# Patient Record
Sex: Male | Born: 2000 | Race: White | Hispanic: No | Marital: Single | Smoking: Never smoker
Health system: Southern US, Community
[De-identification: ages and names within clinical notes are randomized; demographics above are authoritative.]

## PROBLEM LIST (undated history)

## (undated) HISTORY — PX: APPENDECTOMY: SHX54

---
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
## 2019-01-31 ENCOUNTER — Other Ambulatory Visit: Payer: Self-pay

## 2019-01-31 ENCOUNTER — Emergency Department (HOSPITAL_COMMUNITY): Payer: Medicaid Other

## 2019-01-31 ENCOUNTER — Emergency Department (HOSPITAL_COMMUNITY): Payer: Medicaid Other | Admitting: Anesthesiology

## 2019-01-31 ENCOUNTER — Inpatient Hospital Stay (HOSPITAL_COMMUNITY): Payer: Medicaid Other

## 2019-01-31 ENCOUNTER — Inpatient Hospital Stay (HOSPITAL_COMMUNITY)
Admission: EM | Admit: 2019-01-31 | Discharge: 2019-02-05 | DRG: 958 | Disposition: A | Discharge status: Home / Self Care | Payer: Medicaid Other | Attending: Physician Assistant | Admitting: Physician Assistant

## 2019-01-31 ENCOUNTER — Encounter (HOSPITAL_COMMUNITY): Payer: Self-pay

## 2019-01-31 ENCOUNTER — Encounter (HOSPITAL_COMMUNITY): Admission: EM | Disposition: A | Discharge status: Home / Self Care | Payer: Self-pay | Source: Home / Self Care

## 2019-01-31 DIAGNOSIS — D62 Acute posthemorrhagic anemia: Secondary | ICD-10-CM | POA: Diagnosis present

## 2019-01-31 DIAGNOSIS — S82202C Unspecified fracture of shaft of left tibia, initial encounter for open fracture type IIIA, IIIB, or IIIC: Secondary | ICD-10-CM

## 2019-01-31 DIAGNOSIS — T148XXA Other injury of unspecified body region, initial encounter: Secondary | ICD-10-CM

## 2019-01-31 DIAGNOSIS — Z23 Encounter for immunization: Secondary | ICD-10-CM | POA: Diagnosis not present

## 2019-01-31 DIAGNOSIS — S92061B Displaced intraarticular fracture of right calcaneus, initial encounter for open fracture: Secondary | ICD-10-CM

## 2019-01-31 DIAGNOSIS — S37012A Minor contusion of left kidney, initial encounter: Secondary | ICD-10-CM | POA: Diagnosis present

## 2019-01-31 DIAGNOSIS — S82251C Displaced comminuted fracture of shaft of right tibia, initial encounter for open fracture type IIIA, IIIB, or IIIC: Secondary | ICD-10-CM | POA: Diagnosis present

## 2019-01-31 DIAGNOSIS — S92251A Displaced fracture of navicular [scaphoid] of right foot, initial encounter for closed fracture: Secondary | ICD-10-CM | POA: Diagnosis present

## 2019-01-31 DIAGNOSIS — R40241 Glasgow coma scale score 13-15, unspecified time: Secondary | ICD-10-CM | POA: Diagnosis present

## 2019-01-31 DIAGNOSIS — Z880 Allergy status to penicillin: Secondary | ICD-10-CM

## 2019-01-31 DIAGNOSIS — Y92410 Unspecified street and highway as the place of occurrence of the external cause: Secondary | ICD-10-CM | POA: Diagnosis not present

## 2019-01-31 DIAGNOSIS — Z1159 Encounter for screening for other viral diseases: Secondary | ICD-10-CM | POA: Diagnosis not present

## 2019-01-31 DIAGNOSIS — S9304XA Dislocation of right ankle joint, initial encounter: Secondary | ICD-10-CM | POA: Diagnosis present

## 2019-01-31 DIAGNOSIS — M21851 Other specified acquired deformities of right thigh: Secondary | ICD-10-CM | POA: Diagnosis present

## 2019-01-31 DIAGNOSIS — S92001B Unspecified fracture of right calcaneus, initial encounter for open fracture: Secondary | ICD-10-CM | POA: Diagnosis present

## 2019-01-31 DIAGNOSIS — S12600A Unspecified displaced fracture of seventh cervical vertebra, initial encounter for closed fracture: Secondary | ICD-10-CM | POA: Diagnosis present

## 2019-01-31 DIAGNOSIS — S93314A Dislocation of tarsal joint of right foot, initial encounter: Secondary | ICD-10-CM

## 2019-01-31 DIAGNOSIS — S92001A Unspecified fracture of right calcaneus, initial encounter for closed fracture: Secondary | ICD-10-CM

## 2019-01-31 DIAGNOSIS — S82201B Unspecified fracture of shaft of right tibia, initial encounter for open fracture type I or II: Secondary | ICD-10-CM | POA: Diagnosis present

## 2019-01-31 DIAGNOSIS — Z419 Encounter for procedure for purposes other than remedying health state, unspecified: Secondary | ICD-10-CM

## 2019-01-31 DIAGNOSIS — S37019A Minor contusion of unspecified kidney, initial encounter: Secondary | ICD-10-CM

## 2019-01-31 HISTORY — PX: APPLICATION OF WOUND VAC: SHX5189

## 2019-01-31 HISTORY — PX: EXTERNAL FIXATION LEG: SHX1549

## 2019-01-31 HISTORY — PX: ORIF ANKLE FRACTURE: SUR919

## 2019-01-31 LAB — COMPREHENSIVE METABOLIC PANEL
ALT: 35 U/L (ref 0–44)
AST: 34 U/L (ref 15–41)
Albumin: 4.2 g/dL (ref 3.5–5.0)
Alkaline Phosphatase: 55 U/L (ref 38–126)
Anion gap: 12 (ref 5–15)
BUN: 10 mg/dL (ref 6–20)
CO2: 21 mmol/L — ABNORMAL LOW (ref 22–32)
Calcium: 9.6 mg/dL (ref 8.9–10.3)
Chloride: 105 mmol/L (ref 98–111)
Creatinine, Ser: 0.96 mg/dL (ref 0.61–1.24)
GFR calc Af Amer: >60 mL/min (ref >60)
GFR calc non Af Amer: >60 mL/min (ref >60)
Glucose, Bld: 182 mg/dL — ABNORMAL HIGH (ref 70–99)
Potassium: 4 mmol/L (ref 3.5–5.1)
Sodium: 138 mmol/L (ref 135–145)
Total Bilirubin: 1 mg/dL (ref 0.3–1.2)
Total Protein: 7.1 g/dL (ref 6.5–8.1)

## 2019-01-31 LAB — CBC
HCT: 46.3 % (ref 39.0–52.0)
Hemoglobin: 15.1 g/dL (ref 13.0–17.0)
MCH: 31.1 pg (ref 26.0–34.0)
MCHC: 32.6 g/dL (ref 30.0–36.0)
MCV: 95.5 fL (ref 80.0–100.0)
Platelets: 322 10*3/uL (ref 150–400)
RBC: 4.85 MIL/uL (ref 4.22–5.81)
RDW: 12 % (ref 11.5–15.5)
WBC: 17.7 10*3/uL — ABNORMAL HIGH (ref 4.0–10.5)
nRBC: 0 % (ref 0.0–0.2)

## 2019-01-31 LAB — I-STAT CHEM 8, ED
BUN: 12 mg/dL (ref 6–20)
Calcium, Ion: 1.06 mmol/L — ABNORMAL LOW (ref 1.15–1.40)
Chloride: 107 mmol/L (ref 98–111)
Creatinine, Ser: 0.9 mg/dL (ref 0.61–1.24)
Glucose, Bld: 180 mg/dL — ABNORMAL HIGH (ref 70–99)
HCT: 46 % (ref 39.0–52.0)
Hemoglobin: 15.6 g/dL (ref 13.0–17.0)
Potassium: 3.8 mmol/L (ref 3.5–5.1)
Sodium: 139 mmol/L (ref 135–145)
TCO2: 23 mmol/L (ref 22–32)

## 2019-01-31 LAB — PROTIME-INR
INR: 1 (ref 0.8–1.2)
Prothrombin Time: 13 seconds (ref 11.4–15.2)

## 2019-01-31 LAB — SARS CORONAVIRUS 2 BY RT PCR (HOSPITAL ORDER, PERFORMED IN ~~LOC~~ HOSPITAL LAB): SARS Coronavirus 2: NEGATIVE

## 2019-01-31 LAB — ABO/RH: ABO/RH(D): A POS

## 2019-01-31 LAB — ETHANOL: Alcohol, Ethyl (B): <10 mg/dL (ref <10)

## 2019-01-31 SURGERY — EXTERNAL FIXATION, LOWER EXTREMITY
Anesthesia: General | Site: Leg Lower | Laterality: Right

## 2019-01-31 MED ORDER — FENTANYL CITRATE (PF) 100 MCG/2ML IJ SOLN
INTRAMUSCULAR | Status: AC
  Filled 2019-01-31: qty 2

## 2019-01-31 MED ORDER — ENOXAPARIN SODIUM 40 MG/0.4ML ~~LOC~~ SOLN
40.0000 mg | Freq: Every day | SUBCUTANEOUS | Status: DC
  Administered 2019-02-01 – 2019-02-04 (×3): 40 mg via SUBCUTANEOUS
  Filled 2019-01-31 (×3): qty 0.4

## 2019-01-31 MED ORDER — SUFENTANIL CITRATE 50 MCG/ML IV SOLN
INTRAVENOUS | Status: DC | PRN
  Administered 2019-01-31 (×2): 10 ug via INTRAVENOUS

## 2019-01-31 MED ORDER — ACETAMINOPHEN 160 MG/5ML PO SOLN: 1000.0000 mg | Freq: Once | ORAL | Status: DC | PRN

## 2019-01-31 MED ORDER — POTASSIUM CHLORIDE IN NACL 20-0.9 MEQ/L-% IV SOLN
INTRAVENOUS | Status: DC
  Administered 2019-02-01: via INTRAVENOUS
  Filled 2019-01-31: qty 1000

## 2019-01-31 MED ORDER — SUFENTANIL CITRATE 50 MCG/ML IV SOLN
INTRAVENOUS | Status: AC
  Filled 2019-01-31: qty 1

## 2019-01-31 MED ORDER — PROPOFOL 10 MG/ML IV BOLUS
INTRAVENOUS | Status: DC | PRN
  Administered 2019-01-31 (×2): 50 mg via INTRAVENOUS
  Administered 2019-01-31: 100 mg via INTRAVENOUS
  Administered 2019-01-31: 50 mg via INTRAVENOUS

## 2019-01-31 MED ORDER — SODIUM CHLORIDE 0.9 % IR SOLN
Status: DC | PRN
  Administered 2019-01-31 (×2): 3000 mL

## 2019-01-31 MED ORDER — OXYCODONE HCL 5 MG PO TABS: 5.0000 mg | ORAL_TABLET | Freq: Once | ORAL | Status: DC | PRN

## 2019-01-31 MED ORDER — LIDOCAINE 2% (20 MG/ML) 5 ML SYRINGE
INTRAMUSCULAR | Status: DC | PRN
  Administered 2019-01-31: 60 mg via INTRAVENOUS

## 2019-01-31 MED ORDER — KETOROLAC TROMETHAMINE 30 MG/ML IJ SOLN
INTRAMUSCULAR | Status: AC
  Filled 2019-01-31: qty 1

## 2019-01-31 MED ORDER — CEFAZOLIN SODIUM-DEXTROSE 1-4 GM/50ML-% IV SOLN
1.0000 g | Freq: Once | INTRAVENOUS | Status: AC
  Administered 2019-01-31: 1 g via INTRAVENOUS
  Filled 2019-01-31: qty 50

## 2019-01-31 MED ORDER — SUCCINYLCHOLINE CHLORIDE 200 MG/10ML IV SOSY
PREFILLED_SYRINGE | INTRAVENOUS | Status: DC | PRN
  Administered 2019-01-31: 80 mg via INTRAVENOUS

## 2019-01-31 MED ORDER — LIDOCAINE 2% (20 MG/ML) 5 ML SYRINGE
INTRAMUSCULAR | Status: AC
  Filled 2019-01-31: qty 5

## 2019-01-31 MED ORDER — VANCOMYCIN HCL 1000 MG IV SOLR
INTRAVENOUS | Status: AC
  Filled 2019-01-31: qty 1000

## 2019-01-31 MED ORDER — FENTANYL CITRATE (PF) 100 MCG/2ML IJ SOLN: 50.0000 ug | Freq: Once | INTRAMUSCULAR | Status: DC

## 2019-01-31 MED ORDER — TETANUS-DIPHTH-ACELL PERTUSSIS 5-2.5-18.5 LF-MCG/0.5 IM SUSP
0.5000 mL | Freq: Once | INTRAMUSCULAR | Status: AC
  Administered 2019-01-31: 0.5 mL via INTRAMUSCULAR
  Filled 2019-01-31: qty 0.5

## 2019-01-31 MED ORDER — CEFAZOLIN SODIUM-DEXTROSE 2-4 GM/100ML-% IV SOLN
INTRAVENOUS | Status: AC
  Filled 2019-01-31: qty 100

## 2019-01-31 MED ORDER — CEFAZOLIN SODIUM-DEXTROSE 2-3 GM-%(50ML) IV SOLR
INTRAVENOUS | Status: DC | PRN
  Administered 2019-01-31: 2 g via INTRAVENOUS

## 2019-01-31 MED ORDER — VANCOMYCIN HCL 1000 MG IV SOLR
INTRAVENOUS | Status: DC | PRN
  Administered 2019-01-31: 1000 mg

## 2019-01-31 MED ORDER — HYDROMORPHONE HCL 1 MG/ML IJ SOLN
1.0000 mg | Freq: Once | INTRAMUSCULAR | Status: AC
  Administered 2019-01-31: 1 mg via INTRAVENOUS
  Filled 2019-01-31: qty 1

## 2019-01-31 MED ORDER — VANCOMYCIN HCL IN DEXTROSE 1-5 GM/200ML-% IV SOLN
INTRAVENOUS | Status: AC
  Filled 2019-01-31: qty 200

## 2019-01-31 MED ORDER — DEXAMETHASONE SODIUM PHOSPHATE 10 MG/ML IJ SOLN
INTRAMUSCULAR | Status: DC | PRN
  Administered 2019-01-31: 10 mg via INTRAVENOUS

## 2019-01-31 MED ORDER — IOHEXOL 300 MG/ML  SOLN
100.0000 mL | Freq: Once | INTRAMUSCULAR | Status: AC | PRN
  Administered 2019-01-31: 100 mL via INTRAVENOUS

## 2019-01-31 MED ORDER — CLINDAMYCIN PHOSPHATE 300 MG/50ML IV SOLN: 300.0000 mg | Freq: Once | INTRAVENOUS | Status: DC

## 2019-01-31 MED ORDER — PROPOFOL 10 MG/ML IV BOLUS
INTRAVENOUS | Status: AC
  Filled 2019-01-31: qty 20

## 2019-01-31 MED ORDER — SODIUM CHLORIDE (PF) 0.9 % IJ SOLN
INTRAMUSCULAR | Status: AC
  Filled 2019-01-31: qty 10

## 2019-01-31 MED ORDER — TOBRAMYCIN SULFATE 1.2 G IJ SOLR
INTRAMUSCULAR | Status: AC
  Filled 2019-01-31: qty 1.2

## 2019-01-31 MED ORDER — OXYCODONE HCL 5 MG/5ML PO SOLN: 5.0000 mg | Freq: Once | ORAL | Status: DC | PRN

## 2019-01-31 MED ORDER — GENTAMICIN SULFATE 40 MG/ML IJ SOLN: 300.0000 mg | Freq: Once | INTRAVENOUS | Status: DC

## 2019-01-31 MED ORDER — ACETAMINOPHEN 10 MG/ML IV SOLN: 1000.0000 mg | Freq: Once | INTRAVENOUS | Status: DC | PRN

## 2019-01-31 MED ORDER — DEXAMETHASONE SODIUM PHOSPHATE 10 MG/ML IJ SOLN
INTRAMUSCULAR | Status: AC
  Filled 2019-01-31: qty 1

## 2019-01-31 MED ORDER — LIDOCAINE-EPINEPHRINE (PF) 2 %-1:200000 IJ SOLN
20.0000 mL | Freq: Once | INTRAMUSCULAR | Status: DC
  Filled 2019-01-31: qty 20

## 2019-01-31 MED ORDER — TOBRAMYCIN SULFATE 1.2 G IJ SOLR
INTRAMUSCULAR | Status: DC | PRN
  Administered 2019-01-31: 1.2 g

## 2019-01-31 MED ORDER — KETOROLAC TROMETHAMINE 30 MG/ML IJ SOLN
INTRAMUSCULAR | Status: DC | PRN
  Administered 2019-01-31: 30 mg via INTRAVENOUS

## 2019-01-31 MED ORDER — LACTATED RINGERS IV SOLN
INTRAVENOUS | Status: DC | PRN
  Administered 2019-01-31 (×2): via INTRAVENOUS

## 2019-01-31 MED ORDER — FENTANYL CITRATE (PF) 100 MCG/2ML IJ SOLN
25.0000 ug | INTRAMUSCULAR | Status: DC | PRN
  Administered 2019-01-31: 50 ug via INTRAVENOUS

## 2019-01-31 MED ORDER — ONDANSETRON HCL 4 MG/2ML IJ SOLN
INTRAMUSCULAR | Status: DC | PRN
  Administered 2019-01-31: 4 mg via INTRAVENOUS

## 2019-01-31 MED ORDER — VANCOMYCIN HCL 1 G IV SOLR
1000.0000 mg | INTRAVENOUS | Status: AC
  Filled 2019-01-31: qty 1000

## 2019-01-31 MED ORDER — ACETAMINOPHEN 500 MG PO TABS: 1000.0000 mg | ORAL_TABLET | Freq: Once | ORAL | Status: DC | PRN

## 2019-01-31 MED ORDER — MIDAZOLAM HCL 2 MG/2ML IJ SOLN
INTRAMUSCULAR | Status: AC
  Filled 2019-01-31: qty 2

## 2019-01-31 MED ORDER — MIDAZOLAM HCL 5 MG/5ML IJ SOLN
INTRAMUSCULAR | Status: DC | PRN
  Administered 2019-01-31: 2 mg via INTRAVENOUS

## 2019-01-31 MED ORDER — 0.9 % SODIUM CHLORIDE (POUR BTL) OPTIME
TOPICAL | Status: DC | PRN
  Administered 2019-01-31: 21:00:00 1000 mL

## 2019-01-31 MED ORDER — ONDANSETRON HCL 4 MG/2ML IJ SOLN
INTRAMUSCULAR | Status: AC
  Filled 2019-01-31: qty 2

## 2019-01-31 MED ORDER — SUCCINYLCHOLINE CHLORIDE 200 MG/10ML IV SOSY
PREFILLED_SYRINGE | INTRAVENOUS | Status: AC
  Filled 2019-01-31: qty 10

## 2019-01-31 SURGICAL SUPPLY — 72 items
ALCOHOL 70% 16 OZ (MISCELLANEOUS) ×3 IMPLANT
BANDAGE ACE 4X5 VEL STRL LF (GAUZE/BANDAGES/DRESSINGS) IMPLANT
BANDAGE ACE 6X5 VEL STRL LF (GAUZE/BANDAGES/DRESSINGS) IMPLANT
BANDAGE ELASTIC 4 VELCRO ST LF (GAUZE/BANDAGES/DRESSINGS) ×3 IMPLANT
BANDAGE ELASTIC 6 VELCRO ST LF (GAUZE/BANDAGES/DRESSINGS) ×3 IMPLANT
BIT DRILL 110X2.5XQCK CNCT (BIT) ×1 IMPLANT
BIT DRILL 2.5 (BIT) ×2
BIT DRILL 2.7 (BIT) ×1
BIT DRILL 2.7MM (BIT) ×1 IMPLANT
BIT DRL 110X2.5XQCK CNCT (BIT) ×1
BNDG COHESIVE 6X5 TAN STRL LF (GAUZE/BANDAGES/DRESSINGS) IMPLANT
BNDG ESMARK 4X9 LF (GAUZE/BANDAGES/DRESSINGS) ×3 IMPLANT
BNDG GAUZE ELAST 4 BULKY (GAUZE/BANDAGES/DRESSINGS) IMPLANT
CANISTER WOUND CARE 500ML ATS (WOUND CARE) ×3 IMPLANT
CONNECTOR Y ATS VAC SYSTEM (MISCELLANEOUS) ×3 IMPLANT
COVER SURGICAL LIGHT HANDLE (MISCELLANEOUS) ×3 IMPLANT
COVER WAND RF STERILE (DRAPES) ×3 IMPLANT
DRAPE C-ARM 42X72 X-RAY (DRAPES) IMPLANT
DRAPE C-ARMOR (DRAPES) ×3 IMPLANT
DRAPE INCISE IOBAN 66X45 STRL (DRAPES) ×3 IMPLANT
DRAPE U-SHAPE 47X51 STRL (DRAPES) ×3 IMPLANT
DRILL BIT 2.7MM (BIT) ×2
DRSG MEPILEX BORDER 4X4 (GAUZE/BANDAGES/DRESSINGS) ×3 IMPLANT
DRSG VAC ATS SM SENSATRAC (GAUZE/BANDAGES/DRESSINGS) ×3 IMPLANT
ELECT REM PT RETURN 9FT ADLT (ELECTROSURGICAL) ×3
ELECTRODE REM PT RTRN 9FT ADLT (ELECTROSURGICAL) ×1 IMPLANT
GAUZE SPONGE 4X4 12PLY STRL (GAUZE/BANDAGES/DRESSINGS) IMPLANT
GAUZE XEROFORM 5X9 LF (GAUZE/BANDAGES/DRESSINGS) IMPLANT
GLOVE BIO SURGEON STRL SZ7.5 (GLOVE) ×3 IMPLANT
GLOVE BIOGEL PI IND STRL 7.0 (GLOVE) ×1 IMPLANT
GLOVE BIOGEL PI IND STRL 8 (GLOVE) ×2 IMPLANT
GLOVE BIOGEL PI INDICATOR 7.0 (GLOVE) ×2
GLOVE BIOGEL PI INDICATOR 8 (GLOVE) ×4
GOWN STRL REUS W/ TWL LRG LVL3 (GOWN DISPOSABLE) ×2 IMPLANT
GOWN STRL REUS W/ TWL XL LVL3 (GOWN DISPOSABLE) ×1 IMPLANT
GOWN STRL REUS W/TWL LRG LVL3 (GOWN DISPOSABLE) ×4
GOWN STRL REUS W/TWL XL LVL3 (GOWN DISPOSABLE) ×2
HANDPIECE INTERPULSE COAX TIP (DISPOSABLE) ×2
KIT BASIN OR (CUSTOM PROCEDURE TRAY) ×3 IMPLANT
KIT TURNOVER KIT B (KITS) ×3 IMPLANT
NEEDLE 22X1 1/2 (OR ONLY) (NEEDLE) IMPLANT
NS IRRIG 1000ML POUR BTL (IV SOLUTION) ×3 IMPLANT
PACK ORTHO EXTREMITY (CUSTOM PROCEDURE TRAY) ×3 IMPLANT
PAD ARMBOARD 7.5X6 YLW CONV (MISCELLANEOUS) ×6 IMPLANT
PAD CAST 4YDX4 CTTN HI CHSV (CAST SUPPLIES) ×1 IMPLANT
PAD NEG PRESSURE SENSATRAC (MISCELLANEOUS) ×3 IMPLANT
PADDING CAST COTTON 4X4 STRL (CAST SUPPLIES) ×2
PADDING CAST COTTON 6X4 STRL (CAST SUPPLIES) ×9 IMPLANT
PLATE COMPRESSION 12HOLE (Plate) ×3 IMPLANT
SCREW CORT 2.5X28X3.5XST SM (Screw) ×1 IMPLANT
SCREW CORTICAL 3.5X28MM (Screw) ×2 IMPLANT
SCREW CORTICAL 3.5X38MM (Screw) ×3 IMPLANT
SCREW LOCK 30X3.5X M THRD (Screw) ×2 IMPLANT
SCREW LOCKING 3.5 46MM (Screw) ×6 IMPLANT
SCREW LOCKING 3.5X28MM (Screw) ×6 IMPLANT
SCREW LOCKING 3.5X30MM (Screw) ×4 IMPLANT
SET HNDPC FAN SPRY TIP SCT (DISPOSABLE) ×1 IMPLANT
SPLINT PLASTER CAST XFAST 5X30 (CAST SUPPLIES) ×1 IMPLANT
SPLINT PLASTER XFAST SET 5X30 (CAST SUPPLIES) ×2
SPONGE LAP 18X18 RF (DISPOSABLE) IMPLANT
STAPLER VISISTAT 35W (STAPLE) IMPLANT
STOCKINETTE IMPERVIOUS LG (DRAPES) ×3 IMPLANT
SUCTION FRAZIER TIP 10 FR DISP (SUCTIONS) ×3 IMPLANT
SUT ETHILON 2 0 FS 18 (SUTURE) ×15 IMPLANT
SYR CONTROL 10ML LL (SYRINGE) IMPLANT
TOWEL OR 17X24 6PK STRL BLUE (TOWEL DISPOSABLE) ×6 IMPLANT
TOWEL OR 17X26 10 PK STRL BLUE (TOWEL DISPOSABLE) ×6 IMPLANT
TUBE CONNECTING 12'X1/4 (SUCTIONS) ×1
TUBE CONNECTING 12X1/4 (SUCTIONS) ×2 IMPLANT
UNDERPAD 30X30 (UNDERPADS AND DIAPERS) ×3 IMPLANT
WATER STERILE IRR 1000ML POUR (IV SOLUTION) IMPLANT
YANKAUER SUCT BULB TIP NO VENT (SUCTIONS) ×3 IMPLANT

## 2019-01-31 NOTE — ED Notes (Signed)
ED resident at bedside to suture lac to left arm, OR aware of delay.
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
## 2019-01-31 NOTE — Transfer of Care (Signed)
Immediate Anesthesia Transfer of Care Note  Patient: Caleb Bentley  Procedure(s) Performed: OPERATIVE FIXATION  LOWER LEG (Right Leg Lower) Application Of Wound Vac (Right Leg Lower)  Patient Location: PACU  Anesthesia Type:General  Level of Consciousness: oriented, sedated, drowsy, patient cooperative and responds to stimulation  Airway & Oxygen Therapy: Patient Spontanous Breathing  Post-op Assessment: Report given to RN, Post -op Vital signs reviewed and stable and Patient moving all extremities X 4  Post vital signs: Reviewed and stable  Last Vitals:  Vitals Value Taken Time  BP 140/90 01/31/2019 11:08 PM  Temp 37.2 C 01/31/2019 11:06 PM  Pulse 107 01/31/2019 11:14 PM  Resp 21 01/31/2019 11:14 PM  SpO2 98 % 01/31/2019 11:14 PM  Vitals shown include unvalidated device data.  Last Pain:  Vitals:   01/31/19 1655  TempSrc:   PainSc: 6          Complications: No apparent anesthesia complications
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
## 2019-01-31 NOTE — Anesthesia Procedure Notes (Signed)
Procedure Name: Intubation Date/Time: 01/31/2019 9:01 PM Performed by: Melina Schools, CRNA Pre-anesthesia Checklist: Patient identified, Emergency Drugs available, Suction available, Patient being monitored and Timeout performed Patient Re-evaluated:Patient Re-evaluated prior to induction Oxygen Delivery Method: Circle system utilized Preoxygenation: Pre-oxygenation with 100% oxygen Induction Type: IV induction, Rapid sequence and Cricoid Pressure applied Laryngoscope Size: Glidescope and 4 Grade View: Grade I Tube type: Oral Tube size: 8.0 mm Number of attempts: 1 Airway Equipment and Method: Stylet and Video-laryngoscopy Placement Confirmation: positive ETCO2 and breath sounds checked- equal and bilateral Secured at: 24 cm Tube secured with: Tape Dental Injury: Teeth and Oropharynx as per pre-operative assessment

## 2019-02-01 ENCOUNTER — Encounter (HOSPITAL_COMMUNITY): Payer: Self-pay | Admitting: General Practice

## 2019-02-01 LAB — CBC
HCT: 37.5 % — ABNORMAL LOW (ref 39.0–52.0)
Hemoglobin: 12.2 g/dL — ABNORMAL LOW (ref 13.0–17.0)
MCH: 30.9 pg (ref 26.0–34.0)
MCHC: 32.5 g/dL (ref 30.0–36.0)
MCV: 94.9 fL (ref 80.0–100.0)
Platelets: 259 10*3/uL (ref 150–400)
RBC: 3.95 MIL/uL — ABNORMAL LOW (ref 4.22–5.81)
RDW: 12.2 % (ref 11.5–15.5)
WBC: 22.8 10*3/uL — ABNORMAL HIGH (ref 4.0–10.5)
nRBC: 0 % (ref 0.0–0.2)

## 2019-02-01 LAB — COMPREHENSIVE METABOLIC PANEL
ALT: 36 U/L (ref 0–44)
AST: 50 U/L — ABNORMAL HIGH (ref 15–41)
Albumin: 3.5 g/dL (ref 3.5–5.0)
Alkaline Phosphatase: 48 U/L (ref 38–126)
Anion gap: 13 (ref 5–15)
BUN: 10 mg/dL (ref 6–20)
CO2: 21 mmol/L — ABNORMAL LOW (ref 22–32)
Calcium: 8.5 mg/dL — ABNORMAL LOW (ref 8.9–10.3)
Chloride: 104 mmol/L (ref 98–111)
Creatinine, Ser: 1.12 mg/dL (ref 0.61–1.24)
GFR calc Af Amer: >60 mL/min (ref >60)
GFR calc non Af Amer: >60 mL/min (ref >60)
Glucose, Bld: 181 mg/dL — ABNORMAL HIGH (ref 70–99)
Potassium: 4.2 mmol/L (ref 3.5–5.1)
Sodium: 138 mmol/L (ref 135–145)
Total Bilirubin: 0.6 mg/dL (ref 0.3–1.2)
Total Protein: 6 g/dL — ABNORMAL LOW (ref 6.5–8.1)

## 2019-02-01 LAB — TYPE AND SCREEN
ABO/RH(D): A POS
Antibody Screen: NEGATIVE

## 2019-02-01 MED ORDER — PIPERACILLIN-TAZOBACTAM 3.375 G IVPB 30 MIN: 3.3750 g | Freq: Four times a day (QID) | INTRAVENOUS | Status: DC

## 2019-02-01 MED ORDER — ENOXAPARIN SODIUM 40 MG/0.4ML ~~LOC~~ SOLN: 40.0000 mg | SUBCUTANEOUS | Status: DC

## 2019-02-01 MED ORDER — ONDANSETRON HCL 4 MG/2ML IJ SOLN
4.0000 mg | Freq: Four times a day (QID) | INTRAMUSCULAR | Status: DC | PRN
  Administered 2019-02-01 – 2019-02-05 (×2): 4 mg via INTRAVENOUS
  Filled 2019-02-01: qty 2

## 2019-02-01 MED ORDER — ACETAMINOPHEN 325 MG PO TABS
650.0000 mg | ORAL_TABLET | ORAL | Status: DC | PRN
  Administered 2019-02-01: 650 mg via ORAL
  Filled 2019-02-01: qty 2

## 2019-02-01 MED ORDER — OXYCODONE HCL 5 MG PO TABS: 5.0000 mg | ORAL_TABLET | ORAL | Status: DC | PRN

## 2019-02-01 MED ORDER — PIPERACILLIN-TAZOBACTAM 3.375 G IVPB
3.3750 g | Freq: Three times a day (TID) | INTRAVENOUS | Status: DC
  Administered 2019-02-01 – 2019-02-03 (×8): 3.375 g via INTRAVENOUS
  Filled 2019-02-01 (×5): qty 50

## 2019-02-01 MED ORDER — METOPROLOL TARTRATE 5 MG/5ML IV SOLN: 5.0000 mg | Freq: Four times a day (QID) | INTRAVENOUS | Status: DC | PRN

## 2019-02-01 MED ORDER — HYDROMORPHONE HCL 1 MG/ML IJ SOLN
1.0000 mg | INTRAMUSCULAR | Status: DC | PRN
  Administered 2019-02-01: 1 mg via INTRAVENOUS
  Filled 2019-02-01: qty 1

## 2019-02-01 MED ORDER — ONDANSETRON 4 MG PO TBDP: 4.0000 mg | ORAL_TABLET | Freq: Four times a day (QID) | ORAL | Status: DC | PRN

## 2019-02-01 MED ORDER — OXYCODONE HCL 5 MG PO TABS
10.0000 mg | ORAL_TABLET | ORAL | Status: DC | PRN
  Administered 2019-02-01 – 2019-02-02 (×3): 10 mg via ORAL
  Filled 2019-02-01 (×3): qty 2

## 2019-02-01 NOTE — Progress Notes (Signed)
ORTHOPAEDIC PROGRESS NOTE  s/p Procedure(s): OPERATIVE FIXATION  LOWER LEG Application Of Wound Vac  SUBJECTIVE: Reports mild pain about operative site. No chest pain. No SOB. No nausea/vomiting. No other complaints.  OBJECTIVE: PE: RLE: wwp toes, wiggling toes, vac in place  Vitals:   02/01/19 0014 02/01/19 0538  BP: 139/85 (!) 141/67  Pulse: (!) 107 99  Resp: 14 16  Temp: 98.5 F (36.9 C) 99 F (37.2 C)  SpO2: 98% 100%     ASSESSMENT: Caleb Bentley is a 18 y.o. male doing well postoperatively.  PLAN: Weightbearing: NWB RLE Insicional and dressing care: Dressings left intact until follow-up Orthopedic device(s): Wound LPN:PYYFR next surgery. Showering: keep wdressing dry VTE prophylaxis: Lovenox 40mg  qd until next surgery Pain control: prn meds Follow - up plan: tbd, likely surgery with DR. Haddix Wednesday Contact information:  Weekdays 8-5 Ramond Marrow MD [phone], After hours and holidays please check [email or web address] for group call information for [facility]
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
## 2019-02-02 ENCOUNTER — Encounter (HOSPITAL_COMMUNITY): Payer: Self-pay | Admitting: Orthopaedic Surgery

## 2019-02-02 ENCOUNTER — Inpatient Hospital Stay (HOSPITAL_COMMUNITY): Payer: Medicaid Other

## 2019-02-02 MED ORDER — DOCUSATE SODIUM 100 MG PO CAPS
100.0000 mg | ORAL_CAPSULE | Freq: Every day | ORAL | Status: DC
  Administered 2019-02-02 – 2019-02-04 (×2): 100 mg via ORAL
  Filled 2019-02-02 (×2): qty 1

## 2019-02-02 MED ORDER — METHOCARBAMOL 500 MG PO TABS
500.0000 mg | ORAL_TABLET | Freq: Four times a day (QID) | ORAL | Status: DC | PRN
  Administered 2019-02-03 – 2019-02-04 (×2): 500 mg via ORAL
  Filled 2019-02-02 (×2): qty 1

## 2019-02-02 MED ORDER — POLYETHYLENE GLYCOL 3350 17 G PO PACK
17.0000 g | PACK | Freq: Every day | ORAL | Status: DC
  Administered 2019-02-02 – 2019-02-04 (×2): 17 g via ORAL
  Filled 2019-02-02 (×2): qty 1

## 2019-02-02 MED ORDER — ACETAMINOPHEN 325 MG PO TABS
650.0000 mg | ORAL_TABLET | ORAL | Status: DC
  Administered 2019-02-02 (×3): 650 mg via ORAL
  Filled 2019-02-02 (×3): qty 2
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
## 2019-02-03 ENCOUNTER — Encounter (HOSPITAL_COMMUNITY): Payer: Self-pay

## 2019-02-03 ENCOUNTER — Inpatient Hospital Stay (HOSPITAL_COMMUNITY): Payer: Medicaid Other

## 2019-02-03 ENCOUNTER — Inpatient Hospital Stay (HOSPITAL_COMMUNITY): Payer: Medicaid Other | Admitting: Certified Registered Nurse Anesthetist

## 2019-02-03 ENCOUNTER — Encounter (HOSPITAL_COMMUNITY): Admission: EM | Disposition: A | Discharge status: Home / Self Care | Payer: Self-pay | Source: Home / Self Care

## 2019-02-03 HISTORY — PX: TIBIA IM NAIL INSERTION: SHX2516

## 2019-02-03 LAB — CBC
HCT: 31.2 % — ABNORMAL LOW (ref 39.0–52.0)
Hemoglobin: 10.4 g/dL — ABNORMAL LOW (ref 13.0–17.0)
MCH: 31.2 pg (ref 26.0–34.0)
MCHC: 33.3 g/dL (ref 30.0–36.0)
MCV: 93.7 fL (ref 80.0–100.0)
Platelets: 201 10*3/uL (ref 150–400)
RBC: 3.33 MIL/uL — ABNORMAL LOW (ref 4.22–5.81)
RDW: 11.9 % (ref 11.5–15.5)
WBC: 12.8 10*3/uL — ABNORMAL HIGH (ref 4.0–10.5)
nRBC: 0 % (ref 0.0–0.2)

## 2019-02-03 SURGERY — INSERTION, INTRAMEDULLARY ROD, TIBIA
Anesthesia: General | Site: Leg Lower | Laterality: Right

## 2019-02-03 MED ORDER — BACITRACIN ZINC 500 UNIT/GM EX OINT
TOPICAL_OINTMENT | CUTANEOUS | Status: AC
  Filled 2019-02-03: qty 28.35

## 2019-02-03 MED ORDER — MIDAZOLAM HCL 5 MG/5ML IJ SOLN
INTRAMUSCULAR | Status: DC | PRN
  Administered 2019-02-03: 2 mg via INTRAVENOUS

## 2019-02-03 MED ORDER — HYDROMORPHONE HCL 1 MG/ML IJ SOLN
INTRAMUSCULAR | Status: AC
  Filled 2019-02-03: qty 0.5

## 2019-02-03 MED ORDER — HYDROMORPHONE HCL 1 MG/ML IJ SOLN: 0.2500 mg | INTRAMUSCULAR | Status: DC | PRN

## 2019-02-03 MED ORDER — ROCURONIUM BROMIDE 100 MG/10ML IV SOLN
INTRAVENOUS | Status: DC | PRN
  Administered 2019-02-03: 30 mg via INTRAVENOUS
  Administered 2019-02-03: 20 mg via INTRAVENOUS
  Administered 2019-02-03: 50 mg via INTRAVENOUS

## 2019-02-03 MED ORDER — FENTANYL CITRATE (PF) 250 MCG/5ML IJ SOLN
INTRAMUSCULAR | Status: AC
  Filled 2019-02-03: qty 5

## 2019-02-03 MED ORDER — OXYCODONE HCL 5 MG PO TABS: 5.0000 mg | ORAL_TABLET | Freq: Once | ORAL | Status: DC | PRN

## 2019-02-03 MED ORDER — VANCOMYCIN HCL 1000 MG IV SOLR
INTRAVENOUS | Status: AC
  Filled 2019-02-03: qty 1000

## 2019-02-03 MED ORDER — PROPOFOL 10 MG/ML IV BOLUS
INTRAVENOUS | Status: DC | PRN
  Administered 2019-02-03: 200 mg via INTRAVENOUS

## 2019-02-03 MED ORDER — CEFAZOLIN SODIUM-DEXTROSE 2-3 GM-%(50ML) IV SOLR
INTRAVENOUS | Status: DC | PRN
  Administered 2019-02-03: 2 g via INTRAVENOUS

## 2019-02-03 MED ORDER — PROMETHAZINE HCL 25 MG/ML IJ SOLN: 6.2500 mg | INTRAMUSCULAR | Status: DC | PRN

## 2019-02-03 MED ORDER — LIDOCAINE 2% (20 MG/ML) 5 ML SYRINGE
INTRAMUSCULAR | Status: AC
  Filled 2019-02-03: qty 5

## 2019-02-03 MED ORDER — CEFAZOLIN SODIUM 1 G IJ SOLR
INTRAMUSCULAR | Status: AC
  Filled 2019-02-03: qty 20

## 2019-02-03 MED ORDER — OXYCODONE HCL 5 MG/5ML PO SOLN: 5.0000 mg | Freq: Once | ORAL | Status: DC | PRN

## 2019-02-03 MED ORDER — TOBRAMYCIN SULFATE 1.2 G IJ SOLR
INTRAMUSCULAR | Status: AC
  Filled 2019-02-03: qty 1.2

## 2019-02-03 MED ORDER — SUCCINYLCHOLINE CHLORIDE 20 MG/ML IJ SOLN
INTRAMUSCULAR | Status: DC | PRN
  Administered 2019-02-03: 100 mg via INTRAVENOUS

## 2019-02-03 MED ORDER — PROPOFOL 10 MG/ML IV BOLUS
INTRAVENOUS | Status: AC
  Filled 2019-02-03: qty 20

## 2019-02-03 MED ORDER — LIDOCAINE HCL (CARDIAC) PF 100 MG/5ML IV SOSY
PREFILLED_SYRINGE | INTRAVENOUS | Status: DC | PRN
  Administered 2019-02-03: 100 mg via INTRAVENOUS

## 2019-02-03 MED ORDER — ONDANSETRON HCL 4 MG/2ML IJ SOLN
INTRAMUSCULAR | Status: AC
  Filled 2019-02-03: qty 2

## 2019-02-03 MED ORDER — KETOROLAC TROMETHAMINE 30 MG/ML IJ SOLN: 30.0000 mg | Freq: Once | INTRAMUSCULAR | Status: DC | PRN

## 2019-02-03 MED ORDER — DEXAMETHASONE SODIUM PHOSPHATE 10 MG/ML IJ SOLN
INTRAMUSCULAR | Status: AC
  Filled 2019-02-03: qty 1

## 2019-02-03 MED ORDER — HYDROMORPHONE HCL 1 MG/ML IJ SOLN: 1.0000 mg | INTRAMUSCULAR | Status: DC | PRN

## 2019-02-03 MED ORDER — METHYLENE BLUE 0.5 % INJ SOLN
INTRAVENOUS | Status: AC
  Filled 2019-02-03: qty 10

## 2019-02-03 MED ORDER — SUGAMMADEX SODIUM 200 MG/2ML IV SOLN
INTRAVENOUS | Status: DC | PRN
  Administered 2019-02-03: 200 mg via INTRAVENOUS

## 2019-02-03 MED ORDER — 0.9 % SODIUM CHLORIDE (POUR BTL) OPTIME
TOPICAL | Status: DC | PRN
  Administered 2019-02-03: 1000 mL

## 2019-02-03 MED ORDER — DEXMEDETOMIDINE HCL 200 MCG/2ML IV SOLN
INTRAVENOUS | Status: DC | PRN
  Administered 2019-02-03 (×2): 20 ug via INTRAVENOUS

## 2019-02-03 MED ORDER — TOBRAMYCIN SULFATE 1.2 G IJ SOLR
INTRAMUSCULAR | Status: DC | PRN
  Administered 2019-02-03: 1.2 g
  Administered 2019-02-03: 1.2 g via TOPICAL

## 2019-02-03 MED ORDER — SUCCINYLCHOLINE CHLORIDE 200 MG/10ML IV SOSY
PREFILLED_SYRINGE | INTRAVENOUS | Status: AC
  Filled 2019-02-03: qty 10

## 2019-02-03 MED ORDER — CEFAZOLIN SODIUM-DEXTROSE 2-4 GM/100ML-% IV SOLN
2.0000 g | Freq: Three times a day (TID) | INTRAVENOUS | Status: AC
  Administered 2019-02-03 – 2019-02-04 (×3): 2 g via INTRAVENOUS
  Filled 2019-02-03 (×3): qty 100

## 2019-02-03 MED ORDER — HYDROMORPHONE HCL 1 MG/ML IJ SOLN
INTRAMUSCULAR | Status: DC | PRN
  Administered 2019-02-03 (×2): 0.5 mg via INTRAVENOUS

## 2019-02-03 MED ORDER — FENTANYL CITRATE (PF) 100 MCG/2ML IJ SOLN
INTRAMUSCULAR | Status: DC | PRN
  Administered 2019-02-03: 50 ug via INTRAVENOUS
  Administered 2019-02-03 (×2): 100 ug via INTRAVENOUS

## 2019-02-03 MED ORDER — ONDANSETRON HCL 4 MG/2ML IJ SOLN
INTRAMUSCULAR | Status: DC | PRN
  Administered 2019-02-03: 4 mg via INTRAVENOUS

## 2019-02-03 MED ORDER — METHYLENE BLUE 0.5 % INJ SOLN
INTRAVENOUS | Status: DC | PRN
  Administered 2019-02-03: 10 mL

## 2019-02-03 MED ORDER — GABAPENTIN 100 MG PO CAPS
100.0000 mg | ORAL_CAPSULE | Freq: Three times a day (TID) | ORAL | Status: DC
  Administered 2019-02-03 – 2019-02-05 (×6): 100 mg via ORAL
  Filled 2019-02-03 (×6): qty 1

## 2019-02-03 MED ORDER — LACTATED RINGERS IV SOLN
INTRAVENOUS | Status: DC
  Administered 2019-02-03 (×2): via INTRAVENOUS

## 2019-02-03 MED ORDER — VANCOMYCIN HCL 1000 MG IV SOLR
INTRAVENOUS | Status: DC | PRN
  Administered 2019-02-03: 1000 mg
  Administered 2019-02-03: 1000 mg via TOPICAL

## 2019-02-03 MED ORDER — MIDAZOLAM HCL 2 MG/2ML IJ SOLN
INTRAMUSCULAR | Status: AC
  Filled 2019-02-03: qty 2

## 2019-02-03 MED ORDER — EPHEDRINE 5 MG/ML INJ
INTRAVENOUS | Status: AC
  Filled 2019-02-03: qty 10

## 2019-02-03 MED ORDER — OXYCODONE HCL 5 MG PO TABS
5.0000 mg | ORAL_TABLET | ORAL | Status: DC | PRN
  Administered 2019-02-03 – 2019-02-04 (×2): 5 mg via ORAL
  Administered 2019-02-04: 10 mg via ORAL
  Administered 2019-02-04 (×2): 5 mg via ORAL
  Administered 2019-02-05 (×2): 10 mg via ORAL
  Filled 2019-02-03 (×2): qty 2
  Filled 2019-02-03 (×3): qty 1
  Filled 2019-02-03: qty 2
  Filled 2019-02-03: qty 1

## 2019-02-03 MED ORDER — DEXAMETHASONE SODIUM PHOSPHATE 10 MG/ML IJ SOLN
INTRAMUSCULAR | Status: DC | PRN
  Administered 2019-02-03: 10 mg via INTRAVENOUS

## 2019-02-03 MED ORDER — ACETAMINOPHEN 325 MG PO TABS
650.0000 mg | ORAL_TABLET | Freq: Four times a day (QID) | ORAL | Status: DC
  Administered 2019-02-03 – 2019-02-05 (×7): 650 mg via ORAL
  Filled 2019-02-03 (×8): qty 2

## 2019-02-03 MED ORDER — SODIUM CHLORIDE 0.9 % IR SOLN
Status: DC | PRN
  Administered 2019-02-03: 9000 mL

## 2019-02-03 MED ORDER — ROCURONIUM BROMIDE 10 MG/ML (PF) SYRINGE
PREFILLED_SYRINGE | INTRAVENOUS | Status: AC
  Filled 2019-02-03: qty 20

## 2019-02-03 SURGICAL SUPPLY — 71 items
BANDAGE ACE 4X5 VEL STRL LF (GAUZE/BANDAGES/DRESSINGS) ×3 IMPLANT
BANDAGE ACE 6X5 VEL STRL LF (GAUZE/BANDAGES/DRESSINGS) ×3 IMPLANT
BANDAGE ELASTIC 4 VELCRO ST LF (GAUZE/BANDAGES/DRESSINGS) ×2 IMPLANT
BIT DRILL CALIBRATED 4.3X320MM (BIT) IMPLANT
BIT DRILL CANNULATED (DRILL) IMPLANT
BIT DRILL CROWE POINT TWST 4.3 (DRILL) IMPLANT
BLADE SURG 10 STRL SS (BLADE) ×6 IMPLANT
BNDG COHESIVE 4X5 TAN STRL (GAUZE/BANDAGES/DRESSINGS) ×3 IMPLANT
BNDG ELASTIC 6X10 VLCR STRL LF (GAUZE/BANDAGES/DRESSINGS) ×2 IMPLANT
BNDG GAUZE ELAST 4 BULKY (GAUZE/BANDAGES/DRESSINGS) ×3 IMPLANT
BRUSH SCRUB SURG 4.25 DISP (MISCELLANEOUS) ×6 IMPLANT
CEMENT BONE R 1X40 (Cement) ×2 IMPLANT
CHLORAPREP W/TINT 26ML (MISCELLANEOUS) ×3 IMPLANT
COVER SURGICAL LIGHT HANDLE (MISCELLANEOUS) ×6 IMPLANT
COVER WAND RF STERILE (DRAPES) ×3 IMPLANT
DRAPE C-ARM 42X72 X-RAY (DRAPES) ×3 IMPLANT
DRAPE C-ARMOR (DRAPES) ×3 IMPLANT
DRAPE HALF SHEET 40X57 (DRAPES) ×6 IMPLANT
DRAPE IMP U-DRAPE 54X76 (DRAPES) ×6 IMPLANT
DRAPE INCISE IOBAN 66X45 STRL (DRAPES) IMPLANT
DRAPE ORTHO SPLIT 77X108 STRL (DRAPES) ×4
DRAPE SURG ORHT 6 SPLT 77X108 (DRAPES) ×2 IMPLANT
DRAPE U-SHAPE 47X51 STRL (DRAPES) ×3 IMPLANT
DRILL CALIBRATED 4.3X320MM (BIT) ×3
DRILL CANNULATED (DRILL) ×3
DRILL CROWE POINT TWIST 4.3 (DRILL) ×6
DRSG ADAPTIC 3X8 NADH LF (GAUZE/BANDAGES/DRESSINGS) ×3 IMPLANT
ELECT REM PT RETURN 9FT ADLT (ELECTROSURGICAL) ×3
ELECTRODE REM PT RTRN 9FT ADLT (ELECTROSURGICAL) ×1 IMPLANT
GAUZE SPONGE 4X4 12PLY STRL (GAUZE/BANDAGES/DRESSINGS) ×3 IMPLANT
GLOVE BIO SURGEON STRL SZ 6.5 (GLOVE) ×8 IMPLANT
GLOVE BIO SURGEON STRL SZ7.5 (GLOVE) ×12 IMPLANT
GLOVE BIO SURGEONS STRL SZ 6.5 (GLOVE) ×5
GLOVE BIOGEL PI IND STRL 6.5 (GLOVE) ×1 IMPLANT
GLOVE BIOGEL PI IND STRL 7.0 (GLOVE) IMPLANT
GLOVE BIOGEL PI IND STRL 7.5 (GLOVE) ×1 IMPLANT
GLOVE BIOGEL PI INDICATOR 6.5 (GLOVE) ×4
GLOVE BIOGEL PI INDICATOR 7.0 (GLOVE) ×2
GLOVE BIOGEL PI INDICATOR 7.5 (GLOVE) ×2
GOWN STRL REUS W/ TWL LRG LVL3 (GOWN DISPOSABLE) ×2 IMPLANT
GOWN STRL REUS W/TWL LRG LVL3 (GOWN DISPOSABLE) ×4
GUIDEPIN 3.2MM DIA 12INL (PIN) ×6 IMPLANT
GUIDEWIRE 2.6X80 BEAD TIP (WIRE) IMPLANT
GUIDEWIRE BALL NOSE 80CM (WIRE) ×2 IMPLANT
GUIDWIRE 2.6X80 BEAD TIP (WIRE) ×9
IV NS IRRIG 3000ML ARTHROMATIC (IV SOLUTION) ×6 IMPLANT
KIT BASIN OR (CUSTOM PROCEDURE TRAY) ×3 IMPLANT
KIT TURNOVER KIT B (KITS) ×3 IMPLANT
NAIL TIBIAL PHOENIX 9.0X370MM (Nail) ×2 IMPLANT
PACK TOTAL JOINT (CUSTOM PROCEDURE TRAY) ×3 IMPLANT
PAD ARMBOARD 7.5X6 YLW CONV (MISCELLANEOUS) ×6 IMPLANT
PAD CAST 4YDX4 CTTN HI CHSV (CAST SUPPLIES) IMPLANT
PADDING CAST COTTON 4X4 STRL (CAST SUPPLIES) ×4
PADDING CAST COTTON 6X4 STRL (CAST SUPPLIES) ×2 IMPLANT
SCREW CANN FT ST 8.1X80X108 (Screw) ×2 IMPLANT
SCREW CORT TI DBL LEAD 5X38 (Screw) ×2 IMPLANT
SCREW CORT TI DBL LEAD 5X42 (Screw) ×2 IMPLANT
SCREW CORT TI DBL LEAD 5X60 (Screw) ×4 IMPLANT
SCREW CORT TI DBLE LEAD 5X52 (Screw) ×2 IMPLANT
STAPLER VISISTAT 35W (STAPLE) ×3 IMPLANT
SUCTION FRAZIER HANDLE 10FR (MISCELLANEOUS) ×4
SUCTION TUBE FRAZIER 10FR DISP (MISCELLANEOUS) IMPLANT
SUT MNCRL AB 3-0 PS2 18 (SUTURE) ×3 IMPLANT
SUT VIC AB 0 CT1 27 (SUTURE)
SUT VIC AB 0 CT1 27XBRD ANBCTR (SUTURE) IMPLANT
SUT VIC AB 2-0 CT1 27 (SUTURE)
SUT VIC AB 2-0 CT1 TAPERPNT 27 (SUTURE) IMPLANT
TOWEL OR 17X24 6PK STRL BLUE (TOWEL DISPOSABLE) ×3 IMPLANT
TOWEL OR 17X26 10 PK STRL BLUE (TOWEL DISPOSABLE) ×6 IMPLANT
TOWER CARTRIDGE SMART MIX (DISPOSABLE) ×2 IMPLANT
YANKAUER SUCT BULB TIP NO VENT (SUCTIONS) ×4 IMPLANT

## 2019-02-03 NOTE — Transfer of Care (Signed)
Immediate Anesthesia Transfer of Care Note  Patient: Caleb Bentley  Procedure(s) Performed: INTRAMEDULLARY (IM) NAIL TIBIAL W/ ANTIBIOTIC SPACER PLACEMENT  ORIF CALCANEUS WITH WOUND VAC APPLICATION (Right Leg Lower)  Patient Location: PACU  Anesthesia Type:General  Level of Consciousness: drowsy  Airway & Oxygen Therapy: Patient Spontanous Breathing and Patient connected to nasal cannula oxygen  Post-op Assessment: Report given to RN, Post -op Vital signs reviewed and stable and Patient moving all extremities  Post vital signs: Reviewed and stable  Last Vitals:  Vitals Value Taken Time  BP 123/68 02/03/2019  3:54 PM  Temp    Pulse 90 02/03/2019  4:01 PM  Resp 13 02/03/2019  4:01 PM  SpO2 100 % 02/03/2019  4:01 PM  Vitals shown include unvalidated device data.  Last Pain:  Vitals:   02/03/19 1024  TempSrc:   PainSc: 0-No pain      Patients Stated Pain Goal: 3 (02/02/19 0757)  Complications: No apparent anesthesia complications

## 2019-02-03 NOTE — Anesthesia Procedure Notes (Signed)
Procedure Name: Intubation Date/Time: 02/03/2019 11:28 AM Performed by: Candis Shine, CRNA Pre-anesthesia Checklist: Patient identified, Emergency Drugs available, Suction available and Patient being monitored Patient Re-evaluated:Patient Re-evaluated prior to induction Oxygen Delivery Method: Circle System Utilized Preoxygenation: Pre-oxygenation with 100% oxygen Induction Type: IV induction and Rapid sequence Laryngoscope Size: Mac and 4 Grade View: Grade I Tube type: Oral Tube size: 7.5 mm Number of attempts: 1 Airway Equipment and Method: Stylet Placement Confirmation: ETT inserted through vocal cords under direct vision,  positive ETCO2 and breath sounds checked- equal and bilateral Secured at: 22 cm Tube secured with: Tape Dental Injury: Teeth and Oropharynx as per pre-operative assessment

## 2019-02-03 NOTE — Interval H&P Note (Signed)
History and Physical Interval Note:  02/03/2019 10:42 AM  Caleb Bentley  has presented today for surgery, with the diagnosis of Right open tibia fracture.  The various methods of treatment have been discussed with the patient and family. After consideration of risks, benefits and other options for treatment, the patient has consented to  Procedure(s): INTRAMEDULLARY (IM) NAIL TIBIAL W/ ANTIBIOTIC SPACER PLACEMENT POSSIBLE ORIF CALCANEUS (Right) as a surgical intervention.  The patient's history has been reviewed, patient examined, no change in status, stable for surgery.  I have reviewed the patient's chart and labs.  Questions were answered to the patient's satisfaction.     Caryn Bee P Mikki Ziff

## 2019-02-03 NOTE — Progress Notes (Signed)
PT Cancellation Note  Patient Details Name: Caleb Bentley MRN: [id number] DOB: 2001-02-13   Cancelled Treatment:    Reason Eval/Treat Not Completed: (P) Patient at procedure or test/unavailable(Pt off unit for surgical intervention to place IM nail to R tibia with antibiotic spacer and possible R ORIF to R calcaneous.  Will defer PT intervention at this time.  )    Florestine Avers 02/03/2019, 11:08 AM  Joycelyn Rua, PTA Acute Rehabilitation Services Pager [phone] Office [phone]

## 2019-02-03 NOTE — Progress Notes (Signed)
Pt just arrived to 6n2 from PACU.

## 2019-02-03 NOTE — Progress Notes (Deleted)
Arrived to 6n3 from PACU at this time

## 2019-02-03 NOTE — Anesthesia Preprocedure Evaluation (Signed)
Anesthesia Evaluation  Patient identified by MRN, date of birth, ID band Patient awake    Reviewed: Allergy & Precautions, NPO status , Patient's Chart, lab work & pertinent test results  Airway Mallampati: II  TM Distance: >3 FB Neck ROM: Full    Dental no notable dental hx.    Pulmonary neg pulmonary ROS,    Pulmonary exam normal breath sounds clear to auscultation       Cardiovascular negative cardio ROS Normal cardiovascular exam Rhythm:Regular Rate:Normal     Neuro/Psych negative neurological ROS  negative psych ROS   GI/Hepatic negative GI ROS, Neg liver ROS,   Endo/Other  negative endocrine ROS  Renal/GU negative Renal ROS  negative genitourinary   Musculoskeletal negative musculoskeletal ROS (+)   Abdominal   Peds negative pediatric ROS (+)  Hematology  (+) anemia ,   Anesthesia Other Findings   Reproductive/Obstetrics negative OB ROS                             Anesthesia Physical Anesthesia Plan  ASA: II  Anesthesia Plan: General   Post-op Pain Management:    Induction: Intravenous  PONV Risk Score and Plan: 2 and Ondansetron, Dexamethasone and Treatment may vary due to age or medical condition  Airway Management Planned: Oral ETT  Additional Equipment:   Intra-op Plan:   Post-operative Plan: Extubation in OR  Informed Consent: I have reviewed the patients History and Physical, chart, labs and discussed the procedure including the risks, benefits and alternatives for the proposed anesthesia with the patient or authorized representative who has indicated his/her understanding and acceptance.   Dental advisory given  Plan Discussed with: CRNA and Surgeon  Anesthesia Plan Comments:         Anesthesia Quick Evaluation  
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
## 2019-02-03 NOTE — Anesthesia Postprocedure Evaluation (Signed)
Anesthesia Post Note  Patient: Caleb Bentley  Procedure(s) Performed: INTRAMEDULLARY (IM) NAIL TIBIAL W/ ANTIBIOTIC SPACER PLACEMENT  ORIF CALCANEUS WITH WOUND VAC APPLICATION (Right Leg Lower)     Patient location during evaluation: PACU Anesthesia Type: General Level of consciousness: awake and alert Pain management: pain level controlled Vital Signs Assessment: post-procedure vital signs reviewed and stable Respiratory status: spontaneous breathing, nonlabored ventilation, respiratory function stable and patient connected to nasal cannula oxygen Cardiovascular status: blood pressure returned to baseline and stable Postop Assessment: no apparent nausea or vomiting Anesthetic complications: no    Last Vitals:  Vitals:   02/03/19 1554 02/03/19 1610  BP: 123/68 127/75  Pulse: (!) 115 92  Resp: 10 13  Temp: 36.6 C   SpO2: 100% 99%    Last Pain:  Vitals:   02/03/19 1554  TempSrc:   PainSc: Asleep                 [name] S

## 2019-02-03 NOTE — Plan of Care (Signed)
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
## 2019-02-04 DIAGNOSIS — S37019A Minor contusion of unspecified kidney, initial encounter: Secondary | ICD-10-CM

## 2019-02-04 DIAGNOSIS — S93314A Dislocation of tarsal joint of right foot, initial encounter: Secondary | ICD-10-CM

## 2019-02-04 DIAGNOSIS — S12600A Unspecified displaced fracture of seventh cervical vertebra, initial encounter for closed fracture: Secondary | ICD-10-CM

## 2019-02-04 DIAGNOSIS — S92251A Displaced fracture of navicular [scaphoid] of right foot, initial encounter for closed fracture: Secondary | ICD-10-CM

## 2019-02-04 DIAGNOSIS — S92061B Displaced intraarticular fracture of right calcaneus, initial encounter for open fracture: Secondary | ICD-10-CM

## 2019-02-04 LAB — CBC
HCT: 27.9 % — ABNORMAL LOW (ref 39.0–52.0)
Hemoglobin: 9.5 g/dL — ABNORMAL LOW (ref 13.0–17.0)
MCH: 31.1 pg (ref 26.0–34.0)
MCHC: 34.1 g/dL (ref 30.0–36.0)
MCV: 91.5 fL (ref 80.0–100.0)
Platelets: 247 10*3/uL (ref 150–400)
RBC: 3.05 MIL/uL — ABNORMAL LOW (ref 4.22–5.81)
RDW: 11.6 % (ref 11.5–15.5)
WBC: 13.1 10*3/uL — ABNORMAL HIGH (ref 4.0–10.5)
nRBC: 0 % (ref 0.0–0.2)
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
## 2019-02-04 NOTE — Discharge Instructions (Addendum)
Orthopaedic Trauma Service Discharge Instructions   General Discharge Instructions  WEIGHT BEARING STATUS: Non-weightbearing on the right leg  RANGE OF MOTION/ACTIVITY: Full range of motion of knee and hip. Do not remove splint  Wound Care: Do not remove splint or get it wet  DVT/PE prophylaxis: Aspirin 325 mg daily   Diet: as you were eating previously.  Can use over the counter stool softeners and bowel preparations, such as Miralax, to help with bowel movements.  Narcotics can be constipating.  Be sure to drink plenty of fluids  PAIN MEDICATION USE AND EXPECTATIONS  You have likely been given narcotic medications to help control your pain.  After a traumatic event that results in an fracture (broken bone) with or without surgery, it is ok to use narcotic pain medications to help control one's pain.  We understand that everyone responds to pain differently and each individual patient will be evaluated on a regular basis for the continued need for narcotic medications. Ideally, narcotic medication use should last no more than 6-8 weeks (coinciding with fracture healing).   As a patient it is your responsibility as well to monitor narcotic medication use and report the amount and frequency you use these medications when you come to your office visit.   We would also advise that if you are using narcotic medications, you should take a dose prior to therapy to maximize you participation.  IF YOU ARE ON NARCOTIC MEDICATIONS IT IS NOT PERMISSIBLE TO OPERATE A MOTOR VEHICLE (MOTORCYCLE/CAR/TRUCK/MOPED) OR HEAVY MACHINERY DO NOT MIX NARCOTICS WITH OTHER CNS (CENTRAL NERVOUS SYSTEM) DEPRESSANTS SUCH AS ALCOHOL   STOP SMOKING OR USING NICOTINE PRODUCTS!!!!  As discussed nicotine severely impairs your body's ability to heal surgical and traumatic wounds but also impairs bone healing.  Wounds and bone heal by forming microscopic blood vessels (angiogenesis) and nicotine is a vasoconstrictor  (essentially, shrinks blood vessels).  Therefore, if vasoconstriction occurs to these microscopic blood vessels they essentially disappear and are unable to deliver necessary nutrients to the healing tissue.  This is one modifiable factor that you can do to dramatically increase your chances of healing your injury.    (This means no smoking, no nicotine gum, patches, etc)  DO NOT USE NONSTEROIDAL ANTI-INFLAMMATORY DRUGS (NSAID'S)  Using products such as Advil (ibuprofen), Aleve (naproxen), Motrin (ibuprofen) for additional pain control during fracture healing can delay and/or prevent the healing response.  If you would like to take over the counter (OTC) medication, Tylenol (acetaminophen) is ok.  However, some narcotic medications that are given for pain control contain acetaminophen as well. Therefore, you should not exceed more than 4000 mg of tylenol in a day if you do not have liver disease.  Also note that there are may OTC medicines, such as cold medicines and allergy medicines that my contain tylenol as well.  If you have any questions about medications and/or interactions please ask your doctor/PA or your pharmacist.      ICE AND ELEVATE INJURED/OPERATIVE EXTREMITY  Using ice and elevating the injured extremity above your heart can help with swelling and pain control.  Icing in a pulsatile fashion, such as 20 minutes on and 20 minutes off, can be followed.    Do not place ice directly on skin. Make sure there is a barrier between to skin and the ice pack.    Using frozen items such as frozen peas works well as the conform nicely to the are that needs to be iced.  USE AN  ACE WRAP OR TED HOSE FOR SWELLING CONTROL  In addition to icing and elevation, Ace wraps or TED hose are used to help limit and resolve swelling.  It is recommended to use Ace wraps or TED hose until you are informed to stop.    When using Ace Wraps start the wrapping distally (farthest away from the body) and wrap proximally  (closer to the body)   Example: If you had surgery on your leg or thing and you do not have a splint on, start the ace wrap at the toes and work your way up to the thigh        If you had surgery on your upper extremity and do not have a splint on, start the ace wrap at your fingers and work your way up to the upper arm  IF YOU ARE IN A SPLINT OR CAST DO NOT REMOVE IT FOR ANY REASON   If your splint gets wet for any reason please contact the office immediately. You may shower in your splint or cast as long as you keep it dry.  This can be done by wrapping in a cast cover or garbage back (or similar)  Do Not stick any thing down your splint or cast such as pencils, money, or hangers to try and scratch yourself with.  If you feel itchy take benadryl as prescribed on the bottle for itching  IF YOU ARE IN A CAM BOOT (BLACK BOOT)  You may remove boot periodically. Perform daily dressing changes as noted below.  Wash the liner of the boot regularly and wear a sock when wearing the boot. It is recommended that you sleep in the boot until told otherwise   CALL THE OFFICE WITH ANY QUESTIONS OR CONCERNS: [phone]   VISIT OUR WEBSITE FOR ADDITIONAL INFORMATION: [email or web address]    Discharge Wound Care Instructions  Do NOT apply any ointments, solutions or lotions to pin sites or surgical wounds.  These prevent needed drainage and even though solutions like hydrogen peroxide kill bacteria, they also damage cells lining the pin sites that help fight infection.  Applying lotions or ointments can keep the wounds moist and can cause them to breakdown and open up as well. This can increase the risk for infection. When in doubt call the office.  Surgical incisions should be dressed daily.  If any drainage is noted, use one layer of adaptic, then gauze, Kerlix, and an ace wrap.  Once the incision is completely dry and without drainage, it may be left open to air out.  Showering may begin 36-48 hours  later.  Cleaning gently with soap and water.  Traumatic wounds should be dressed daily as well.    One layer of adaptic, gauze, Kerlix, then ace wrap.  The adaptic can be discontinued once the draining has ceased    If you have a wet to dry dressing: wet the gauze with saline the squeeze as much saline out so the gauze is moist (not soaking wet), place moistened gauze over wound, then place a dry gauze over the moist one, followed by Kerlix wrap, then ace wrap.     C COLLAR - can remove for bathing otherwise wear at all times. Any questions regarding neck collar call Dr. Maurice Smallstergard

## 2019-02-04 NOTE — Plan of Care (Signed)

## 2019-02-04 NOTE — Progress Notes (Signed)
Orthopedic Tech Progress Note Patient Details:  Caleb Bentley [birth year] [id number] Therapy called requesting crutches for patient Ortho Devices Type of Ortho Device: Crutches Ortho Device/Splint Interventions: Adjustment, Application, Ordered   Post Interventions Patient Tolerated: Well Instructions Provided: Care of device, Adjustment of device   Donald Pore 02/04/2019, 11:24 AM

## 2019-02-04 NOTE — Plan of Care (Signed)
  Problem: Clinical Measurements: Goal: Ability to maintain clinical measurements within normal limits will improve Outcome: Progressing   Problem: Activity: Goal: Risk for activity intolerance will decrease Outcome: Progressing   Problem: Nutrition: Goal: Adequate nutrition will be maintained Outcome: Progressing   Problem: Elimination: Goal: Will not experience complications related to bowel motility Outcome: Adequate for Discharge   Problem: Pain Managment: Goal: General experience of comfort will improve Outcome: Progressing   
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
## 2019-02-05 ENCOUNTER — Encounter (HOSPITAL_COMMUNITY): Admission: EM | Disposition: A | Discharge status: Home / Self Care | Payer: Self-pay | Source: Home / Self Care

## 2019-02-05 ENCOUNTER — Inpatient Hospital Stay (HOSPITAL_COMMUNITY): Payer: Medicaid Other | Admitting: Anesthesiology

## 2019-02-05 ENCOUNTER — Inpatient Hospital Stay (HOSPITAL_COMMUNITY): Payer: Medicaid Other

## 2019-02-05 ENCOUNTER — Encounter (HOSPITAL_COMMUNITY): Payer: Self-pay | Admitting: Student

## 2019-02-05 HISTORY — PX: OPEN REDUCTION INTERNAL FIXATION (ORIF) FOOT LISFRANC FRACTURE: SHX5990

## 2019-02-05 LAB — CBC
HCT: 27.7 % — ABNORMAL LOW (ref 39.0–52.0)
Hemoglobin: 9.4 g/dL — ABNORMAL LOW (ref 13.0–17.0)
MCH: 31.3 pg (ref 26.0–34.0)
MCHC: 33.9 g/dL (ref 30.0–36.0)
MCV: 92.3 fL (ref 80.0–100.0)
Platelets: 243 10*3/uL (ref 150–400)
RBC: 3 MIL/uL — ABNORMAL LOW (ref 4.22–5.81)
RDW: 11.7 % (ref 11.5–15.5)
WBC: 10.8 10*3/uL — ABNORMAL HIGH (ref 4.0–10.5)
nRBC: 0 % (ref 0.0–0.2)

## 2019-02-05 SURGERY — OPEN REDUCTION INTERNAL FIXATION (ORIF) FOOT LISFRANC FRACTURE
Anesthesia: General | Site: Foot | Laterality: Right

## 2019-02-05 MED ORDER — POLYETHYLENE GLYCOL 3350 17 G PO PACK: 17.0000 g | PACK | Freq: Every day | ORAL | 0 refills | Status: DC | PRN

## 2019-02-05 MED ORDER — VANCOMYCIN HCL 1000 MG IV SOLR
INTRAVENOUS | Status: DC | PRN
  Administered 2019-02-05: 1000 mg

## 2019-02-05 MED ORDER — FENTANYL CITRATE (PF) 250 MCG/5ML IJ SOLN
INTRAMUSCULAR | Status: AC
  Filled 2019-02-05: qty 5

## 2019-02-05 MED ORDER — METHOCARBAMOL 500 MG PO TABS: 500.0000 mg | ORAL_TABLET | Freq: Four times a day (QID) | ORAL | 0 refills | Status: DC | PRN

## 2019-02-05 MED ORDER — FENTANYL CITRATE (PF) 250 MCG/5ML IJ SOLN
INTRAMUSCULAR | Status: DC | PRN
  Administered 2019-02-05 (×2): 50 ug via INTRAVENOUS
  Administered 2019-02-05: 100 ug via INTRAVENOUS
  Administered 2019-02-05: 150 ug via INTRAVENOUS

## 2019-02-05 MED ORDER — DEXAMETHASONE SODIUM PHOSPHATE 10 MG/ML IJ SOLN
INTRAMUSCULAR | Status: DC | PRN
  Administered 2019-02-05: 10 mg via INTRAVENOUS

## 2019-02-05 MED ORDER — MIDAZOLAM HCL 2 MG/2ML IJ SOLN
INTRAMUSCULAR | Status: AC
  Filled 2019-02-05: qty 2

## 2019-02-05 MED ORDER — BACITRACIN ZINC 500 UNIT/GM EX OINT
TOPICAL_OINTMENT | CUTANEOUS | Status: AC
  Filled 2019-02-05: qty 28.35

## 2019-02-05 MED ORDER — GABAPENTIN 100 MG PO CAPS: 100.0000 mg | ORAL_CAPSULE | Freq: Three times a day (TID) | ORAL | 0 refills | Status: AC

## 2019-02-05 MED ORDER — ACETAMINOPHEN 325 MG PO TABS: 650.0000 mg | ORAL_TABLET | Freq: Four times a day (QID) | ORAL | Status: DC | PRN

## 2019-02-05 MED ORDER — PROPOFOL 10 MG/ML IV BOLUS
INTRAVENOUS | Status: AC
  Filled 2019-02-05: qty 60

## 2019-02-05 MED ORDER — VANCOMYCIN HCL 1000 MG IV SOLR
INTRAVENOUS | Status: AC
  Filled 2019-02-05: qty 1000

## 2019-02-05 MED ORDER — ASPIRIN EC 325 MG PO TBEC: 325.0000 mg | DELAYED_RELEASE_TABLET | Freq: Every day | ORAL | 0 refills | Status: DC

## 2019-02-05 MED ORDER — BACITRACIN 500 UNIT/GM EX OINT
TOPICAL_OINTMENT | CUTANEOUS | Status: DC | PRN
  Administered 2019-02-05: 1 via TOPICAL

## 2019-02-05 MED ORDER — MIDAZOLAM HCL 5 MG/5ML IJ SOLN
INTRAMUSCULAR | Status: DC | PRN
  Administered 2019-02-05: 2 mg via INTRAVENOUS

## 2019-02-05 MED ORDER — LACTATED RINGERS IV SOLN
INTRAVENOUS | Status: DC | PRN
  Administered 2019-02-05: 07:00:00 via INTRAVENOUS

## 2019-02-05 MED ORDER — CEFAZOLIN SODIUM-DEXTROSE 2-4 GM/100ML-% IV SOLN
2.0000 g | INTRAVENOUS | Status: AC
  Administered 2019-02-05: 2 g via INTRAVENOUS
  Filled 2019-02-05: qty 100

## 2019-02-05 MED ORDER — DOCUSATE SODIUM 100 MG PO CAPS: 100.0000 mg | ORAL_CAPSULE | Freq: Every day | ORAL | 0 refills | Status: DC

## 2019-02-05 MED ORDER — ROCURONIUM BROMIDE 10 MG/ML (PF) SYRINGE
PREFILLED_SYRINGE | INTRAVENOUS | Status: DC | PRN
  Administered 2019-02-05: 100 mg via INTRAVENOUS

## 2019-02-05 MED ORDER — OXYCODONE HCL 5 MG PO TABS: 5.0000 mg | ORAL_TABLET | Freq: Four times a day (QID) | ORAL | 0 refills | Status: DC | PRN

## 2019-02-05 MED ORDER — HYDROMORPHONE HCL 1 MG/ML IJ SOLN: 0.2500 mg | INTRAMUSCULAR | Status: DC | PRN

## 2019-02-05 MED ORDER — OXYCODONE HCL 5 MG PO TABS: 5.0000 mg | ORAL_TABLET | Freq: Once | ORAL | Status: DC | PRN

## 2019-02-05 MED ORDER — PROPOFOL 10 MG/ML IV BOLUS
INTRAVENOUS | Status: DC | PRN
  Administered 2019-02-05: 150 mg via INTRAVENOUS

## 2019-02-05 MED ORDER — TOBRAMYCIN SULFATE 1.2 G IJ SOLR
INTRAMUSCULAR | Status: AC
  Filled 2019-02-05: qty 1.2

## 2019-02-05 MED ORDER — LIDOCAINE 2% (20 MG/ML) 5 ML SYRINGE
INTRAMUSCULAR | Status: DC | PRN
  Administered 2019-02-05: 100 mg via INTRAVENOUS

## 2019-02-05 MED ORDER — PROMETHAZINE HCL 25 MG/ML IJ SOLN: 6.2500 mg | INTRAMUSCULAR | Status: DC | PRN

## 2019-02-05 MED ORDER — OXYCODONE HCL 5 MG/5ML PO SOLN: 5.0000 mg | Freq: Once | ORAL | Status: DC | PRN

## 2019-02-05 MED ORDER — 0.9 % SODIUM CHLORIDE (POUR BTL) OPTIME
TOPICAL | Status: DC | PRN
  Administered 2019-02-05: 1000 mL

## 2019-02-05 MED ORDER — ACETAMINOPHEN 10 MG/ML IV SOLN: 1000.0000 mg | Freq: Once | INTRAVENOUS | Status: DC | PRN

## 2019-02-05 SURGICAL SUPPLY — 64 items
BANDAGE ACE 4X5 VEL STRL LF (GAUZE/BANDAGES/DRESSINGS) IMPLANT
BANDAGE ACE 6X5 VEL STRL LF (GAUZE/BANDAGES/DRESSINGS) ×2 IMPLANT
BANDAGE ELASTIC 4 VELCRO ST LF (GAUZE/BANDAGES/DRESSINGS) ×2 IMPLANT
BANDAGE ESMARK 6X9 LF (GAUZE/BANDAGES/DRESSINGS) ×1 IMPLANT
BIT DRILL 2 FAST STEP (BIT) ×2 IMPLANT
BNDG COHESIVE 4X5 TAN STRL (GAUZE/BANDAGES/DRESSINGS) ×3 IMPLANT
BNDG ESMARK 6X9 LF (GAUZE/BANDAGES/DRESSINGS) ×3
BRUSH SCRUB SURG 4.25 DISP (MISCELLANEOUS) ×4 IMPLANT
CHLORAPREP W/TINT 26ML (MISCELLANEOUS) ×3 IMPLANT
COVER SURGICAL LIGHT HANDLE (MISCELLANEOUS) ×3 IMPLANT
COVER WAND RF STERILE (DRAPES) ×1 IMPLANT
DRAPE C-ARM 42X72 X-RAY (DRAPES) ×3 IMPLANT
DRAPE C-ARMOR (DRAPES) ×3 IMPLANT
DRAPE ORTHO SPLIT 77X108 STRL (DRAPES) ×4
DRAPE SURG ORHT 6 SPLT 77X108 (DRAPES) ×2 IMPLANT
DRAPE U-SHAPE 47X51 STRL (DRAPES) ×3 IMPLANT
DRIVER BIT SQUARE 1.3MM (TRAUMA) ×2 IMPLANT
DRSG ADAPTIC 3X8 NADH LF (GAUZE/BANDAGES/DRESSINGS) ×2 IMPLANT
ELECT REM PT RETURN 9FT ADLT (ELECTROSURGICAL) ×3
ELECTRODE REM PT RTRN 9FT ADLT (ELECTROSURGICAL) ×1 IMPLANT
GAUZE SPONGE 4X4 12PLY STRL (GAUZE/BANDAGES/DRESSINGS) ×2 IMPLANT
GLOVE BIO SURGEON STRL SZ 6.5 (GLOVE) ×6 IMPLANT
GLOVE BIO SURGEON STRL SZ7.5 (GLOVE) ×9 IMPLANT
GLOVE BIO SURGEONS STRL SZ 6.5 (GLOVE) ×3
GLOVE BIOGEL PI IND STRL 6.5 (GLOVE) ×1 IMPLANT
GLOVE BIOGEL PI IND STRL 7.5 (GLOVE) ×1 IMPLANT
GLOVE BIOGEL PI INDICATOR 6.5 (GLOVE) ×2
GLOVE BIOGEL PI INDICATOR 7.5 (GLOVE) ×2
GOWN STRL REUS W/ TWL LRG LVL3 (GOWN DISPOSABLE) ×2 IMPLANT
GOWN STRL REUS W/TWL LRG LVL3 (GOWN DISPOSABLE) ×4
K-WIRE ACE 1.6X6 (WIRE) ×9
KIT TURNOVER KIT B (KITS) ×3 IMPLANT
KWIRE ACE 1.6X6 (WIRE) IMPLANT
MANIFOLD NEPTUNE II (INSTRUMENTS) ×3 IMPLANT
NDL HYPO 21X1.5 SAFETY (NEEDLE) IMPLANT
NEEDLE 25GAX1.5 (MISCELLANEOUS) ×3 IMPLANT
NEEDLE HYPO 21X1.5 SAFETY (NEEDLE) IMPLANT
NS IRRIG 1000ML POUR BTL (IV SOLUTION) ×3 IMPLANT
PACK TOTAL JOINT (CUSTOM PROCEDURE TRAY) ×3 IMPLANT
PAD ARMBOARD 7.5X6 YLW CONV (MISCELLANEOUS) ×6 IMPLANT
PAD CAST 4YDX4 CTTN HI CHSV (CAST SUPPLIES) IMPLANT
PADDING CAST COTTON 4X4 STRL (CAST SUPPLIES) ×2
PADDING CAST COTTON 6X4 STRL (CAST SUPPLIES) ×2 IMPLANT
PEG FULLY THREADED 2.5X22MM (Peg) ×2 IMPLANT
PLATE LOCK SM RT NAVICULAR FT (Plate) ×2 IMPLANT
SCREW PEG 2.5X16 NONLOCK (Screw) ×4 IMPLANT
SCREW PEG 2.5X18 NONLOCK (Screw) ×4 IMPLANT
SCREW PEG 2.5X20 NONLOCK (Screw) ×2 IMPLANT
SCREW PEG LOCK 2.5X18 (Peg) ×2 IMPLANT
SPONGE LAP 18X18 RF (DISPOSABLE) ×2 IMPLANT
STAPLER VISISTAT 35W (STAPLE) ×3 IMPLANT
SUCTION FRAZIER HANDLE 10FR (MISCELLANEOUS) ×2
SUCTION TUBE FRAZIER 10FR DISP (MISCELLANEOUS) ×1 IMPLANT
SUT ETHILON 3 0 PS 1 (SUTURE) ×2 IMPLANT
SUT PROLENE 0 CT (SUTURE) IMPLANT
SUT VIC AB 0 CT1 27 (SUTURE) ×2
SUT VIC AB 0 CT1 27XBRD ANBCTR (SUTURE) ×1 IMPLANT
SUT VIC AB 2-0 CT1 27 (SUTURE) ×2
SUT VIC AB 2-0 CT1 TAPERPNT 27 (SUTURE) ×2 IMPLANT
SYR CONTROL 10ML LL (SYRINGE) ×3 IMPLANT
TOWEL OR 17X24 6PK STRL BLUE (TOWEL DISPOSABLE) ×3 IMPLANT
TOWEL OR 17X26 10 PK STRL BLUE (TOWEL DISPOSABLE) ×6 IMPLANT
UNDERPAD 30X30 (UNDERPADS AND DIAPERS) ×3 IMPLANT
WATER STERILE IRR 1000ML POUR (IV SOLUTION) ×1 IMPLANT
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
## 2019-02-05 NOTE — Anesthesia Postprocedure Evaluation (Signed)
Anesthesia Post Note  Patient: Caleb Bentley  Procedure(s) Performed: ORIF OF RIGHT NAVICULAR  TO RIGHT LOWER EXTREMITY (Right Foot)     Patient location during evaluation: PACU Anesthesia Type: General Level of consciousness: awake and alert Pain management: pain level controlled Vital Signs Assessment: post-procedure vital signs reviewed and stable Respiratory status: spontaneous breathing, nonlabored ventilation and respiratory function stable Cardiovascular status: blood pressure returned to baseline and stable Postop Assessment: no apparent nausea or vomiting Anesthetic complications: no    Last Vitals:  Vitals:   02/05/19 0935 02/05/19 0950  BP: (!) 152/87 (!) 153/83  Pulse: (!) 105 93  Resp: 17 15  Temp: 36.7 C   SpO2: 98% 98%    Last Pain:  Vitals:   02/05/19 0935  TempSrc:   PainSc: 0-No pain                 Kaylyn Layer

## 2019-02-05 NOTE — Interval H&P Note (Signed)
History and Physical Interval Note:  02/05/2019 7:08 AM  Caleb Bentley  has presented today for surgery, with the diagnosis of Right navicular fracture.  The various methods of treatment have been discussed with the patient and family. After consideration of risks, benefits and other options for treatment, the patient has consented to  Procedure(s): ORIF OF RIGHT NAVICULAR WITH WOUND VAC CHANGE TO RIGHT LOWER EXTREMITY (Right) as a surgical intervention.  The patient's history has been reviewed, patient examined, no change in status, stable for surgery.  I have reviewed the patient's chart and labs.  Questions were answered to the patient's satisfaction.     Caryn Bee P Haddix

## 2019-02-05 NOTE — Progress Notes (Signed)
Discharged Pt to home. Pt is alert and stable. Instructions given and explained. Belongings returned.

## 2019-02-05 NOTE — Anesthesia Procedure Notes (Signed)
Procedure Name: Intubation Date/Time: 02/05/2019 7:40 AM Performed by: Marena Chancy, CRNA Pre-anesthesia Checklist: Patient identified, Emergency Drugs available, Suction available and Patient being monitored Patient Re-evaluated:Patient Re-evaluated prior to induction Oxygen Delivery Method: Circle System Utilized Preoxygenation: Pre-oxygenation with 100% oxygen Induction Type: IV induction Laryngoscope Size: Glidescope and 4 Grade View: Grade I Tube type: Oral Tube size: 7.5 mm Number of attempts: 1 Airway Equipment and Method: Stylet and Oral airway Placement Confirmation: ETT inserted through vocal cords under direct vision,  positive ETCO2 and breath sounds checked- equal and bilateral Tube secured with: Tape Dental Injury: Teeth and Oropharynx as per pre-operative assessment

## 2019-02-05 NOTE — Transfer of Care (Signed)
Immediate Anesthesia Transfer of Care Note  Patient: Caleb Bentley  Procedure(s) Performed: ORIF OF RIGHT NAVICULAR  TO RIGHT LOWER EXTREMITY (Right Foot)  Patient Location: PACU  Anesthesia Type:General  Level of Consciousness: awake, alert  and oriented  Airway & Oxygen Therapy: Patient Spontanous Breathing and Patient connected to nasal cannula oxygen  Post-op Assessment: Report given to RN, Post -op Vital signs reviewed and stable and Patient moving all extremities X 4  Post vital signs: Reviewed and stable  Last Vitals:  Vitals Value Taken Time  BP 165/82 02/05/2019  9:19 AM  Temp    Pulse 102 02/05/2019  9:24 AM  Resp 16 02/05/2019  9:24 AM  SpO2 100 % 02/05/2019  9:24 AM  Vitals shown include unvalidated device data.  Last Pain:  Vitals:   02/05/19 0920  TempSrc:   PainSc: (P) 0-No pain      Patients Stated Pain Goal: 3 (02/04/19 1529)  Complications: No apparent anesthesia complications

## 2019-02-08 ENCOUNTER — Encounter (HOSPITAL_COMMUNITY): Payer: Self-pay | Admitting: *Deleted

## 2019-02-08 ENCOUNTER — Encounter (HOSPITAL_COMMUNITY): Payer: Self-pay | Admitting: Student

## 2019-02-09 LAB — HIV ANTIBODY (ROUTINE TESTING W REFLEX): HIV Screen 4th Generation wRfx: NONREACTIVE

## 2019-02-11 NOTE — Anesthesia Postprocedure Evaluation (Signed)
Anesthesia Post Note  Patient: Caleb Bentley  Procedure(s) Performed: OPERATIVE FIXATION  LOWER LEG (Right Leg Lower) Application Of Wound Vac (Right Leg Lower)     Patient location during evaluation: PACU Anesthesia Type: General Level of consciousness: awake and alert Pain management: pain level controlled Vital Signs Assessment: post-procedure vital signs reviewed and stable Respiratory status: spontaneous breathing, nonlabored ventilation, respiratory function stable and patient connected to nasal cannula oxygen Cardiovascular status: blood pressure returned to baseline and stable Postop Assessment: no apparent nausea or vomiting Anesthetic complications: no    Last Vitals:  Vitals:   02/05/19 1011 02/05/19 1053  BP: (!) 147/85 (!) 153/97  Pulse: 92 95  Resp:  18  Temp:  36.9 C  SpO2: 99% 100%    Last Pain:  Vitals:   02/05/19 1053  TempSrc: Oral  PainSc:                  [name]

## 2019-03-23 ENCOUNTER — Ambulatory Visit: Payer: Self-pay | Admitting: Student

## 2019-03-23 DIAGNOSIS — S82202C Unspecified fracture of shaft of left tibia, initial encounter for open fracture type IIIA, IIIB, or IIIC: Secondary | ICD-10-CM

## 2019-03-23 DIAGNOSIS — S82402C Unspecified fracture of shaft of left fibula, initial encounter for open fracture type IIIA, IIIB, or IIIC: Secondary | ICD-10-CM

## 2019-03-24 ENCOUNTER — Other Ambulatory Visit: Payer: Self-pay

## 2019-03-24 ENCOUNTER — Encounter (HOSPITAL_COMMUNITY): Payer: Self-pay | Admitting: *Deleted

## 2019-03-24 ENCOUNTER — Other Ambulatory Visit (HOSPITAL_COMMUNITY)
Admission: RE | Admit: 2019-03-24 | Discharge: 2019-03-24 | Disposition: A | Discharge status: Home / Self Care | Payer: Medicaid Other | Source: Ambulatory Visit | Attending: Student | Admitting: Student

## 2019-03-24 NOTE — Progress Notes (Signed)
Pt denies SOB, chest pain, and being under the care of a cardiologist. Pt denies having a stress test, echo and cardiac cath. Pt denies recent labs. Pt made aware to stop taking Aspirin (unless otherwise advised by surgeon), vitamins, fish oil and herbal medications. Do not take any NSAIDs ie: Ibuprofen, Advil, Naproxen (Aleve), Motrin, BC and Goody Powder. Pt denies that he and family members tested positive for COVID-19 ( pt stated that he is scheduled to be tested on 03/24/19 and reminded to quarantine).   Coronavirus Screening  Pt denies that he and family experienced the following symptoms:  Cough yes/no: No Fever (>100.60F)  yes/no: No Runny nose yes/no: No Sore throat yes/no: No Difficulty breathing/shortness of breath  yes/no: No  Have you or a family member traveled in the last 14 days and where? yes/no: No  Pt reminded that hospital visitation restrictions are in effect and the importance of the restrictions.  Pt verbalized understanding of all pre-op instructions.
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
## 2019-03-25 ENCOUNTER — Ambulatory Visit (HOSPITAL_COMMUNITY): Payer: Medicaid Other | Admitting: Anesthesiology

## 2019-03-25 ENCOUNTER — Other Ambulatory Visit: Payer: Self-pay

## 2019-03-25 ENCOUNTER — Encounter (HOSPITAL_COMMUNITY)
Admission: RE | Disposition: A | Discharge status: Home / Self Care | Payer: Self-pay | Source: Home / Self Care | Attending: Student

## 2019-03-25 ENCOUNTER — Observation Stay (HOSPITAL_COMMUNITY)
Admission: RE | Admit: 2019-03-25 | Discharge: 2019-03-25 | Disposition: A | Discharge status: Home / Self Care | Payer: Medicaid Other | Attending: Student | Admitting: Student

## 2019-03-25 ENCOUNTER — Encounter (HOSPITAL_COMMUNITY): Payer: Self-pay

## 2019-03-25 ENCOUNTER — Ambulatory Visit (HOSPITAL_COMMUNITY): Payer: Medicaid Other

## 2019-03-25 ENCOUNTER — Observation Stay (HOSPITAL_COMMUNITY): Payer: Medicaid Other

## 2019-03-25 DIAGNOSIS — S82301C Unspecified fracture of lower end of right tibia, initial encounter for open fracture type IIIA, IIIB, or IIIC: Secondary | ICD-10-CM | POA: Diagnosis not present

## 2019-03-25 DIAGNOSIS — S93314A Dislocation of tarsal joint of right foot, initial encounter: Secondary | ICD-10-CM | POA: Diagnosis present

## 2019-03-25 DIAGNOSIS — S92001B Unspecified fracture of right calcaneus, initial encounter for open fracture: Secondary | ICD-10-CM | POA: Diagnosis not present

## 2019-03-25 DIAGNOSIS — Z1159 Encounter for screening for other viral diseases: Secondary | ICD-10-CM | POA: Diagnosis not present

## 2019-03-25 DIAGNOSIS — S82401N Unspecified fracture of shaft of right fibula, subsequent encounter for open fracture type IIIA, IIIB, or IIIC with nonunion: Secondary | ICD-10-CM | POA: Insufficient documentation

## 2019-03-25 DIAGNOSIS — T148XXA Other injury of unspecified body region, initial encounter: Secondary | ICD-10-CM

## 2019-03-25 DIAGNOSIS — S82202C Unspecified fracture of shaft of left tibia, initial encounter for open fracture type IIIA, IIIB, or IIIC: Secondary | ICD-10-CM

## 2019-03-25 DIAGNOSIS — S82402C Unspecified fracture of shaft of left fibula, initial encounter for open fracture type IIIA, IIIB, or IIIC: Secondary | ICD-10-CM

## 2019-03-25 DIAGNOSIS — S9304XA Dislocation of right ankle joint, initial encounter: Secondary | ICD-10-CM | POA: Diagnosis not present

## 2019-03-25 DIAGNOSIS — S82201N Unspecified fracture of shaft of right tibia, subsequent encounter for open fracture type IIIA, IIIB, or IIIC with nonunion: Secondary | ICD-10-CM | POA: Insufficient documentation

## 2019-03-25 DIAGNOSIS — S92001A Unspecified fracture of right calcaneus, initial encounter for closed fracture: Secondary | ICD-10-CM | POA: Diagnosis present

## 2019-03-25 DIAGNOSIS — Z419 Encounter for procedure for purposes other than remedying health state, unspecified: Secondary | ICD-10-CM

## 2019-03-25 DIAGNOSIS — S92061B Displaced intraarticular fracture of right calcaneus, initial encounter for open fracture: Secondary | ICD-10-CM | POA: Diagnosis present

## 2019-03-25 HISTORY — PX: REMOVAL OF CEMENTED SPACER KNEE: SHX6056

## 2019-03-25 HISTORY — PX: ORIF CALCANEOUS FRACTURE: SHX5030

## 2019-03-25 HISTORY — PX: HARVEST BONE GRAFT: SHX377

## 2019-03-25 LAB — CBC
HCT: 40.4 % (ref 39.0–52.0)
Hemoglobin: 12.1 g/dL — ABNORMAL LOW (ref 13.0–17.0)
MCH: 26.5 pg (ref 26.0–34.0)
MCHC: 30 g/dL (ref 30.0–36.0)
MCV: 88.4 fL (ref 80.0–100.0)
Platelets: 298 10*3/uL (ref 150–400)
RBC: 4.57 MIL/uL (ref 4.22–5.81)
RDW: 13.3 % (ref 11.5–15.5)
WBC: 8.3 10*3/uL (ref 4.0–10.5)
nRBC: 0 % (ref 0.0–0.2)

## 2019-03-25 LAB — SARS CORONAVIRUS 2 BY RT PCR (HOSPITAL ORDER, PERFORMED IN ~~LOC~~ HOSPITAL LAB): SARS Coronavirus 2: NEGATIVE

## 2019-03-25 SURGERY — OPEN REDUCTION INTERNAL FIXATION (ORIF) CALCANEOUS FRACTURE
Anesthesia: Regional | Site: Leg Upper | Laterality: Right

## 2019-03-25 MED ORDER — PROPOFOL 10 MG/ML IV BOLUS
INTRAVENOUS | Status: AC
  Filled 2019-03-25: qty 20

## 2019-03-25 MED ORDER — VANCOMYCIN HCL 1000 MG IV SOLR
INTRAVENOUS | Status: AC
  Filled 2019-03-25: qty 1000

## 2019-03-25 MED ORDER — CEFAZOLIN SODIUM-DEXTROSE 2-4 GM/100ML-% IV SOLN
2.0000 g | INTRAVENOUS | Status: AC
  Administered 2019-03-25: 2 g via INTRAVENOUS
  Filled 2019-03-25: qty 100

## 2019-03-25 MED ORDER — ACETAMINOPHEN 500 MG PO TABS: 1000.0000 mg | ORAL_TABLET | Freq: Four times a day (QID) | ORAL | Status: DC

## 2019-03-25 MED ORDER — BUPIVACAINE-EPINEPHRINE (PF) 0.5% -1:200000 IJ SOLN
INTRAMUSCULAR | Status: DC | PRN
  Administered 2019-03-25: 25 mL via PERINEURAL
  Administered 2019-03-25: 15 mL via PERINEURAL

## 2019-03-25 MED ORDER — ONDANSETRON HCL 4 MG/2ML IJ SOLN: 4.0000 mg | Freq: Four times a day (QID) | INTRAMUSCULAR | Status: DC | PRN

## 2019-03-25 MED ORDER — FENTANYL CITRATE (PF) 250 MCG/5ML IJ SOLN
INTRAMUSCULAR | Status: AC
  Filled 2019-03-25: qty 5

## 2019-03-25 MED ORDER — ACETAMINOPHEN 500 MG PO TABS
1000.0000 mg | ORAL_TABLET | Freq: Once | ORAL | Status: AC
  Administered 2019-03-25: 06:00:00 1000 mg via ORAL
  Filled 2019-03-25: qty 2

## 2019-03-25 MED ORDER — OXYCODONE HCL 5 MG PO TABS: 5.0000 mg | ORAL_TABLET | ORAL | Status: DC | PRN

## 2019-03-25 MED ORDER — LACTATED RINGERS IV SOLN
INTRAVENOUS | Status: DC | PRN
  Administered 2019-03-25 (×2): via INTRAVENOUS

## 2019-03-25 MED ORDER — POTASSIUM CHLORIDE IN NACL 20-0.9 MEQ/L-% IV SOLN: INTRAVENOUS | Status: DC

## 2019-03-25 MED ORDER — METOCLOPRAMIDE HCL 5 MG PO TABS: 5.0000 mg | ORAL_TABLET | Freq: Three times a day (TID) | ORAL | Status: DC | PRN

## 2019-03-25 MED ORDER — MORPHINE SULFATE (PF) 2 MG/ML IV SOLN: 2.0000 mg | INTRAVENOUS | Status: DC | PRN

## 2019-03-25 MED ORDER — CEFAZOLIN SODIUM-DEXTROSE 2-4 GM/100ML-% IV SOLN: 2.0000 g | Freq: Three times a day (TID) | INTRAVENOUS | Status: DC

## 2019-03-25 MED ORDER — DOCUSATE SODIUM 100 MG PO CAPS: 100.0000 mg | ORAL_CAPSULE | Freq: Two times a day (BID) | ORAL | Status: DC

## 2019-03-25 MED ORDER — CHLORHEXIDINE GLUCONATE 4 % EX LIQD: 60.0000 mL | Freq: Once | CUTANEOUS | Status: DC

## 2019-03-25 MED ORDER — VANCOMYCIN HCL 1000 MG IV SOLR
INTRAVENOUS | Status: DC | PRN
  Administered 2019-03-25: 1000 mg via TOPICAL

## 2019-03-25 MED ORDER — LIDOCAINE 2% (20 MG/ML) 5 ML SYRINGE
INTRAMUSCULAR | Status: AC
  Filled 2019-03-25: qty 10

## 2019-03-25 MED ORDER — ONDANSETRON HCL 4 MG PO TABS: 4.0000 mg | ORAL_TABLET | Freq: Four times a day (QID) | ORAL | Status: DC | PRN

## 2019-03-25 MED ORDER — METOCLOPRAMIDE HCL 5 MG/ML IJ SOLN: 5.0000 mg | Freq: Three times a day (TID) | INTRAMUSCULAR | Status: DC | PRN

## 2019-03-25 MED ORDER — LIDOCAINE 2% (20 MG/ML) 5 ML SYRINGE
INTRAMUSCULAR | Status: DC | PRN
  Administered 2019-03-25: 80 mg via INTRAVENOUS

## 2019-03-25 MED ORDER — DEXAMETHASONE SODIUM PHOSPHATE 10 MG/ML IJ SOLN
INTRAMUSCULAR | Status: AC
  Filled 2019-03-25: qty 1

## 2019-03-25 MED ORDER — DOUBLE ANTIBIOTIC 500-10000 UNIT/GM EX OINT
TOPICAL_OINTMENT | CUTANEOUS | Status: AC
  Filled 2019-03-25: qty 1

## 2019-03-25 MED ORDER — GABAPENTIN 100 MG PO CAPS: 100.0000 mg | ORAL_CAPSULE | Freq: Three times a day (TID) | ORAL | Status: DC

## 2019-03-25 MED ORDER — MIDAZOLAM HCL 2 MG/2ML IJ SOLN
INTRAMUSCULAR | Status: DC | PRN
  Administered 2019-03-25: 2 mg via INTRAVENOUS

## 2019-03-25 MED ORDER — ONDANSETRON HCL 4 MG/2ML IJ SOLN
INTRAMUSCULAR | Status: DC | PRN
  Administered 2019-03-25: 4 mg via INTRAVENOUS

## 2019-03-25 MED ORDER — BACITRACIN 500 UNIT/GM EX OINT
TOPICAL_OINTMENT | CUTANEOUS | Status: DC | PRN
  Administered 2019-03-25: 1 via TOPICAL

## 2019-03-25 MED ORDER — FENTANYL CITRATE (PF) 250 MCG/5ML IJ SOLN
INTRAMUSCULAR | Status: DC | PRN
  Administered 2019-03-25 (×3): 25 ug via INTRAVENOUS
  Administered 2019-03-25 (×2): 50 ug via INTRAVENOUS
  Administered 2019-03-25: 25 ug via INTRAVENOUS
  Administered 2019-03-25 (×2): 50 ug via INTRAVENOUS

## 2019-03-25 MED ORDER — ASPIRIN 325 MG PO TABS: 325.0000 mg | ORAL_TABLET | Freq: Every day | ORAL | Status: DC

## 2019-03-25 MED ORDER — OXYCODONE HCL 5 MG PO TABS: 5.0000 mg | ORAL_TABLET | Freq: Four times a day (QID) | ORAL | 0 refills | Status: AC | PRN

## 2019-03-25 MED ORDER — FENTANYL CITRATE (PF) 100 MCG/2ML IJ SOLN: 25.0000 ug | INTRAMUSCULAR | Status: DC | PRN

## 2019-03-25 MED ORDER — 0.9 % SODIUM CHLORIDE (POUR BTL) OPTIME
TOPICAL | Status: DC | PRN
  Administered 2019-03-25: 1000 mL

## 2019-03-25 MED ORDER — ONDANSETRON HCL 4 MG/2ML IJ SOLN
INTRAMUSCULAR | Status: AC
  Filled 2019-03-25: qty 2

## 2019-03-25 MED ORDER — SODIUM CHLORIDE 0.9 % IR SOLN
Status: DC | PRN
  Administered 2019-03-25: 1500 mL
  Administered 2019-03-25: 3000 mL

## 2019-03-25 MED ORDER — ASPIRIN EC 325 MG PO TBEC: 325.0000 mg | DELAYED_RELEASE_TABLET | Freq: Every day | ORAL | 0 refills | Status: AC

## 2019-03-25 MED ORDER — DEXAMETHASONE SODIUM PHOSPHATE 10 MG/ML IJ SOLN
INTRAMUSCULAR | Status: DC | PRN
  Administered 2019-03-25: 10 mg via INTRAVENOUS

## 2019-03-25 MED ORDER — PROPOFOL 10 MG/ML IV BOLUS
INTRAVENOUS | Status: DC | PRN
  Administered 2019-03-25: 200 mg via INTRAVENOUS

## 2019-03-25 MED ORDER — MIDAZOLAM HCL 2 MG/2ML IJ SOLN
INTRAMUSCULAR | Status: AC
  Filled 2019-03-25: qty 2

## 2019-03-25 SURGICAL SUPPLY — 72 items
BANDAGE ACE 6X5 VEL STRL LF (GAUZE/BANDAGES/DRESSINGS) ×3 IMPLANT
BANDAGE ELASTIC 4 VELCRO ST LF (GAUZE/BANDAGES/DRESSINGS) ×4 IMPLANT
BANDAGE ELASTIC 6 VELCRO ST LF (GAUZE/BANDAGES/DRESSINGS) ×4 IMPLANT
BANDAGE ESMARK 6X9 LF (GAUZE/BANDAGES/DRESSINGS) ×2 IMPLANT
BLADE SURG 10 STRL SS (BLADE) ×4 IMPLANT
BNDG COHESIVE 4X5 TAN STRL (GAUZE/BANDAGES/DRESSINGS) ×4 IMPLANT
BNDG ELASTIC 4X5.8 VLCR STR LF (GAUZE/BANDAGES/DRESSINGS) ×4 IMPLANT
BNDG ESMARK 6X9 LF (GAUZE/BANDAGES/DRESSINGS) ×4
BONE CANC CHIPS 40CC CAN1/2 (Bone Implant) ×4 IMPLANT
BRUSH SCRUB EZ PLAIN DRY (MISCELLANEOUS) ×8 IMPLANT
CHIPS CANC BONE 40CC CAN1/2 (Bone Implant) ×2 IMPLANT
CHLORAPREP W/TINT 26 (MISCELLANEOUS) ×4 IMPLANT
CONNECTOR 5 IN 1 STRAIGHT STRL (MISCELLANEOUS) ×4 IMPLANT
COVER MAYO STAND STRL (DRAPES) ×4 IMPLANT
COVER SURGICAL LIGHT HANDLE (MISCELLANEOUS) ×8 IMPLANT
COVER WAND RF STERILE (DRAPES) ×4 IMPLANT
DRAPE C-ARM 42X72 X-RAY (DRAPES) ×4 IMPLANT
DRAPE C-ARMOR (DRAPES) ×4 IMPLANT
DRAPE INCISE IOBAN 66X45 STRL (DRAPES) ×4 IMPLANT
DRAPE ORTHO SPLIT 77X108 STRL (DRAPES) ×4
DRAPE SURG ORHT 6 SPLT 77X108 (DRAPES) ×4 IMPLANT
DRAPE U-SHAPE 47X51 STRL (DRAPES) ×4 IMPLANT
DRSG ADAPTIC 3X8 NADH LF (GAUZE/BANDAGES/DRESSINGS) ×4 IMPLANT
DRSG EMULSION OIL 3X3 NADH (GAUZE/BANDAGES/DRESSINGS) ×4 IMPLANT
ELECT REM PT RETURN 9FT ADLT (ELECTROSURGICAL) ×4
ELECTRODE REM PT RTRN 9FT ADLT (ELECTROSURGICAL) ×2 IMPLANT
GAUZE SPONGE 4X4 12PLY STRL (GAUZE/BANDAGES/DRESSINGS) ×4 IMPLANT
GLOVE BIO SURGEON STRL SZ 6.5 (GLOVE) ×9 IMPLANT
GLOVE BIO SURGEON STRL SZ7.5 (GLOVE) ×16 IMPLANT
GLOVE BIO SURGEONS STRL SZ 6.5 (GLOVE) ×3
GLOVE BIOGEL PI IND STRL 6.5 (GLOVE) ×2 IMPLANT
GLOVE BIOGEL PI IND STRL 7.5 (GLOVE) ×2 IMPLANT
GLOVE BIOGEL PI INDICATOR 6.5 (GLOVE) ×2
GLOVE BIOGEL PI INDICATOR 7.5 (GLOVE) ×2
GOWN STRL REUS W/ TWL LRG LVL3 (GOWN DISPOSABLE) ×4 IMPLANT
GOWN STRL REUS W/TWL LRG LVL3 (GOWN DISPOSABLE) ×4
GUIDEWIRE 3.2X400 (WIRE) ×8 IMPLANT
GUIDEWIRE THREADED 2.8 (WIRE) ×8 IMPLANT
KIT BASIN OR (CUSTOM PROCEDURE TRAY) ×4 IMPLANT
KIT BONE HARVEST RIA 2 (ORTHOPEDIC DISPOSABLE SUPPLIES) ×4 IMPLANT
KIT TURNOVER KIT B (KITS) ×4 IMPLANT
MANIFOLD NEPTUNE II (INSTRUMENTS) ×4 IMPLANT
MIX DBX 10CC 35% BONE (Bone Implant) ×4 IMPLANT
NEEDLE HYPO 21X1.5 SAFETY (NEEDLE) IMPLANT
NS IRRIG 1000ML POUR BTL (IV SOLUTION) ×4 IMPLANT
PACK ORTHO EXTREMITY (CUSTOM PROCEDURE TRAY) ×4 IMPLANT
PAD ARMBOARD 7.5X6 YLW CONV (MISCELLANEOUS) ×8 IMPLANT
PAD CAST 4YDX4 CTTN HI CHSV (CAST SUPPLIES) ×2 IMPLANT
PADDING CAST COTTON 4X4 STRL (CAST SUPPLIES) ×2
PADDING CAST COTTON 6X4 STRL (CAST SUPPLIES) ×4 IMPLANT
REAMER HEAD RIA 2 11.5 (ORTHOPEDIC DISPOSABLE SUPPLIES) ×4 IMPLANT
REAMER ROD DEEP FLUTE 2.5X950 (INSTRUMENTS) ×4 IMPLANT
SCREW CANN 32 THRD/75 7.3 (Screw) ×4 IMPLANT
SCREW CANN 32 THRD/80 7.3 (Screw) ×4 IMPLANT
SCREW SHANZ 4.0X60MM (EXFIX) IMPLANT
SPONGE LAP 18X18 RF (DISPOSABLE) IMPLANT
SUCTION FRAZIER HANDLE 10FR (MISCELLANEOUS) ×2
SUCTION TUBE FRAZIER 10FR DISP (MISCELLANEOUS) ×2 IMPLANT
SUT ETHILON 3 0 PS 1 (SUTURE) ×8 IMPLANT
SUT MON AB 2-0 CT1 36 (SUTURE) ×8 IMPLANT
SUT VIC AB 0 CT1 27 (SUTURE) ×4
SUT VIC AB 0 CT1 27XBRD ANBCTR (SUTURE) ×4 IMPLANT
SUT VIC AB 2-0 CT1 27 (SUTURE) ×4
SUT VIC AB 2-0 CT1 TAPERPNT 27 (SUTURE) ×4 IMPLANT
SYR CONTROL 10ML LL (SYRINGE) IMPLANT
TOWEL GREEN STERILE (TOWEL DISPOSABLE) ×8 IMPLANT
TOWEL GREEN STERILE FF (TOWEL DISPOSABLE) ×4 IMPLANT
TUBE CONNECTING 12'X1/4 (SUCTIONS) ×2
TUBE CONNECTING 12X1/4 (SUCTIONS) ×6 IMPLANT
UNDERPAD 30X30 (UNDERPADS AND DIAPERS) ×4 IMPLANT
WATER STERILE IRR 1000ML POUR (IV SOLUTION) ×4 IMPLANT
YANKAUER SUCT BULB TIP NO VENT (SUCTIONS) ×4 IMPLANT

## 2019-03-25 NOTE — Progress Notes (Signed)
Orthopedic Tech Progress Note Patient Details:  Caleb Bentley 12/01/2000 [id number]  Ortho Devices Type of Ortho Device: CAM walker       [name] 03/25/2019, 10:49 AM

## 2019-03-25 NOTE — Progress Notes (Signed)
The patient has been given discharge instructions along with a discharge packet to take home with him.  The patient verbalizes understanding those instructions.  The patient was educated on non weightbearing to the right lower extremity.  The compression dressing, immobolizer and cam boot is in place.  The patient acknowledges the follow up and signs and symptoms to call if needed before his 1 week follow up.  The patient continues to having numbness and unable to move toes from the precious surgical block.  Toes are warm and color is pink.  The patient will be discharged to home.

## 2019-03-25 NOTE — Progress Notes (Signed)
Dr Doreatha Martin was contacted in regards to the patient "wanting to go home today"  The patient was made aware of the contact with Dr Doreatha Martin and will plan for discharge this evening
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
## 2019-03-25 NOTE — Discharge Summary (Signed)
Orthopaedic Trauma Service (OTS) Discharge Summary   Patient ID: Caleb Bentley MRN: [id number] DOB/AGE: 05-26-2001 18 y.o.  Admit date: 03/25/2019 Discharge date: 03/25/2019  Admission Diagnoses: 1. Right tibial nonunion 2. Right open calcaneus fracture 3. Subtalar joint dislocation  Discharge Diagnoses:  Principal Problem:   Tibia/fibula fracture, left, open type III, initial encounter Active Problems:   Open intra-articular fracture of calcaneus, right, initial encounter   Closed traumatic dislocation of subtalar joint, right, initial encounter   History reviewed. No pertinent past medical history.   Procedures Performed: 1. CPT 27724-Repair of right distal tibia nonunion with RIA autograft 2. CPT 28725-Right subtalar fusion 3. CPT 20680-Removal of hardware from right calcaneus  Discharged Condition: good  Hospital Course: [facility] for above procedure. Covid testing was negative. Was taken to the operating room by Dr. Jena GaussHaddix and patient tolerated above procedure well without complications. Patient was placed in a CAM boot post-operatively and made non-weightbearing on the right lower extremity. He was initally admitted to the hospital post-ooperatively for observation overnight. Later on day of surgery pain remained well controlled and patient requested discharge home. He is tolerating diet, vital signs stable, dressings clean, dry, intact and patient feels stable for discharge to home. Patient will follow up as below and knows to call with questions or concerns.     Consults: None  Significant Diagnostic Studies: None  Results for orders placed or performed during the hospital encounter of 03/25/19 (from the past 168 hour(s))  CBC   Collection Time: 03/25/19  6:17 AM  Result Value Ref Range   WBC 8.3 4.0 - 10.5 K/uL   RBC 4.57 4.22 - 5.81 MIL/uL   Hemoglobin 12.1 (L) 13.0 - 17.0 g/dL   HCT 40.940.4 81.139.0 - 91.452.0 %   MCV 88.4 80.0 - 100.0 fL   MCH 26.5 26.0 - 34.0 pg   MCHC 30.0 30.0 - 36.0 g/dL   RDW 78.213.3 95.611.5 - 21.315.5 %   Platelets 298 150 - 400 K/uL   nRBC 0.0 0.0 - 0.2 %  SARS Coronavirus 2 (CEPHEID - Performed in [facility] hospital lab), [facility] Order   Collection Time: 03/25/19  7:08 AM   Specimen: Nasopharyngeal Swab  Result Value Ref Range   SARS Coronavirus 2 NEGATIVE NEGATIVE     Treatments: surgery: 1. CPT 27724-Repair of right distal tibia nonunion with RIA autograft 2. CPT 28725-Right subtalar fusion 3. CPT 20680-Removal of hardware from right calcaneus  Discharge Exam: General - Sitting up in bed, NAD Right Lower extremity - CAM boot in place. Dressing clean, dry, intact. Able to wiggle toes. Sensation intact to light touch of distal extremity. Foot warm and well perfused. 2+ DP pulse    Disposition: Discharge disposition: 01-Home or Self Care       Allergies as of 03/25/2019      Reactions   Penicillins Other (See Comments)   Grandmother had reaction to penicillin - throat swelling - pt will not take   Penicillins Other (See Comments)   Family allergy , prefer to avoid if possible      Medication List    STOP taking these medications   acetaminophen 325 MG tablet Commonly known as: TYLENOL   diphenhydrAMINE 25 MG tablet Commonly known as: BENADRYL   docusate sodium 100 MG capsule Commonly known as: COLACE   methocarbamol 500 MG tablet Commonly known as: ROBAXIN     TAKE these medications   aspirin EC 325 MG tablet  Take 1 tablet (325 mg total) by mouth daily.   gabapentin 100 MG capsule Commonly known as: NEURONTIN Take 1 capsule (100 mg total) by mouth 3 (three) times daily. What changed:   when to take this  reasons to take this   oxyCODONE 5 MG immediate release tablet Commonly known as: Oxy IR/ROXICODONE Take 1-2 tablets (5-10 mg total) by mouth every 6 (six) hours as needed for moderate pain. What changed: reasons to take this      Follow-up  Information    Haddix, Thomasene Lot, MD. Schedule an appointment as soon as possible for a visit in 1 week.   Specialty: Orthopedic Surgery Why: For suture removal, For wound re-check Contact information: [address] [place] [address] [phone]           Discharge Instructions and Plan: Patient will be discharged to home. Will be discharged on Aspirin for DVT prophylaxis. Patient already has all the necessary DME at home for discharge. Patient will follow up with Dr. Doreatha Martin in 2 weeks for repeat x-rays and suture removal.   Signed:  Leary Roca. Carmie Kanner ?([phone]? (phone) 03/25/2019, 4:32 PM  [facility] [place] [address] [phone] (F)

## 2019-03-25 NOTE — Progress Notes (Signed)
PT Cancellation Note  Patient Details Name: Caleb Bentley MRN: [id number] DOB: 06-28-2001   Cancelled Treatment:    Reason Eval/Treat Not Completed: PT screened, no needs identified, will sign off. Spoke with patient who is familiar to me from previous admission. Pt reports no questions or concerns, mod indep with RW; plans to return to mother's home with assist available. No DME needs. Pt hopeful to d/c this evening. Will sign off. Please reconsult if new needs arise.  Mabeline Caras, PT, DPT Acute Rehabilitation Services  Pager [phone] Office [place] 03/25/2019, 5:04 PM

## 2019-03-25 NOTE — Discharge Instructions (Addendum)
[facility] Discharge Instructions   General Discharge Instructions  WEIGHT BEARING STATUS: Non-weightbearing on right leg  RANGE OF MOTION/ACTIVITY: Okay for gentle range of motion of ankle  Wound Care: Remove dressing on post-op day 32 (03/27/19). Shower daily and wash incisions with soap and water. Do not scrub incisions. Incisions can be left open to air if there is no drainage. If incision continues to have drainage, follow wound care instructions below. Okay to shower if no drainage from incisions.  DVT/PE prophylaxis: Aspirin 325 mg daily  Diet: as you were eating previously.  Can use over the counter stool softeners and bowel preparations, such as Miralax, to help with bowel movements.  Narcotics can be constipating.  Be sure to drink plenty of fluids  PAIN MEDICATION USE AND EXPECTATIONS  You have likely been given narcotic medications to help control your pain.  After a traumatic event that results in an fracture (broken bone) with or without surgery, it is ok to use narcotic pain medications to help control one's pain.  We understand that everyone responds to pain differently and each individual patient will be evaluated on a regular basis for the continued need for narcotic medications. Ideally, narcotic medication use should last no more than 6-8 weeks (coinciding with fracture healing).   As a patient it is your responsibility as well to monitor narcotic medication use and report the amount and frequency you use these medications when you come to your office visit.   We would also advise that if you are using narcotic medications, you should take a dose prior to therapy to maximize you participation.  IF YOU ARE ON NARCOTIC MEDICATIONS IT IS NOT PERMISSIBLE TO OPERATE A MOTOR VEHICLE (MOTORCYCLE/CAR/TRUCK/MOPED) OR HEAVY MACHINERY DO NOT MIX NARCOTICS WITH OTHER CNS (CENTRAL NERVOUS SYSTEM) DEPRESSANTS SUCH AS ALCOHOL   STOP SMOKING OR USING NICOTINE  PRODUCTS!!!!  As discussed nicotine severely impairs your body's ability to heal surgical and traumatic wounds but also impairs bone healing.  Wounds and bone heal by forming microscopic blood vessels (angiogenesis) and nicotine is a vasoconstrictor (essentially, shrinks blood vessels).  Therefore, if vasoconstriction occurs to these microscopic blood vessels they essentially disappear and are unable to deliver necessary nutrients to the healing tissue.  This is one modifiable factor that you can do to dramatically increase your chances of healing your injury.    (This means no smoking, no nicotine gum, patches, etc)  DO NOT USE NONSTEROIDAL ANTI-INFLAMMATORY DRUGS (NSAID'S)  Using products such as Advil (ibuprofen), Aleve (naproxen), Motrin (ibuprofen) for additional pain control during fracture healing can delay and/or prevent the healing response.  If you would like to take over the counter (OTC) medication, Tylenol (acetaminophen) is ok.  However, some narcotic medications that are given for pain control contain acetaminophen as well. Therefore, you should not exceed more than 4000 mg of tylenol in a day if you do not have liver disease.  Also note that there are may OTC medicines, such as cold medicines and allergy medicines that my contain tylenol as well.  If you have any questions about medications and/or interactions please ask your doctor/PA or your pharmacist.      ICE AND ELEVATE INJURED/OPERATIVE EXTREMITY  Using ice and elevating the injured extremity above your heart can help with swelling and pain control.  Icing in a pulsatile fashion, such as 20 minutes on and 20 minutes off, can be followed.    Do not place ice directly on skin. Make sure there  is a barrier between to skin and the ice pack.    Using frozen items such as frozen peas works well as the conform nicely to the are that needs to be iced.  USE AN ACE WRAP OR TED HOSE FOR SWELLING CONTROL  In addition to icing and elevation,  Ace wraps or TED hose are used to help limit and resolve swelling.  It is recommended to use Ace wraps or TED hose until you are informed to stop.    When using Ace Wraps start the wrapping distally (farthest away from the body) and wrap proximally (closer to the body)   Example: If you had surgery on your leg or thing and you do not have a splint on, start the ace wrap at the toes and work your way up to the thigh        If you had surgery on your upper extremity and do not have a splint on, start the ace wrap at your fingers and work your way up to the upper arm   IF YOU ARE IN A CAM BOOT (BLACK BOOT)  You may remove boot periodically. Perform daily dressing changes as noted below.  Wash the liner of the boot regularly and wear a sock when wearing the boot. It is recommended that you sleep in the boot until told otherwise   CALL THE OFFICE WITH ANY QUESTIONS OR CONCERNS: [phone]   VISIT OUR WEBSITE FOR ADDITIONAL INFORMATION: [email or web address]    Discharge Wound Care Instructions  Do NOT apply any ointments, solutions or lotions to pin sites or surgical wounds.  These prevent needed drainage and even though solutions like hydrogen peroxide kill bacteria, they also damage cells lining the pin sites that help fight infection.  Applying lotions or ointments can keep the wounds moist and can cause them to breakdown and open up as well. This can increase the risk for infection. When in doubt call the office.  Surgical incisions should be dressed daily.  If any drainage is noted, use one layer of adaptic, then gauze, Kerlix, and an ace wrap.  Once the incision is completely dry and without drainage, it may be left open to air out.  Showering may begin 36-48 hours later.  Cleaning gently with soap and water.  Traumatic wounds should be dressed daily as well.    One layer of adaptic, gauze, Kerlix, then ace wrap.  The adaptic can be discontinued once the draining has ceased    If you have  a wet to dry dressing: wet the gauze with saline the squeeze as much saline out so the gauze is moist (not soaking wet), place moistened gauze over wound, then place a dry gauze over the moist one, followed by Kerlix wrap, then ace wrap.

## 2019-03-25 NOTE — Anesthesia Procedure Notes (Signed)
Anesthesia Regional Block: Popliteal block   Pre-Anesthetic Checklist: ,, timeout performed, Correct Patient, Correct Site, Correct Laterality, Correct Procedure, Correct Position, site marked, Risks and benefits discussed,  Surgical consent,  Pre-op evaluation,  At surgeon's request and post-op pain management  Laterality: Right  Prep: Maximum Sterile Barrier Precautions used, chloraprep       Needles:  Injection technique: Single-shot  Needle Type: Echogenic Stimulator Needle     Needle Length: 9cm  Needle Gauge: 22     Additional Needles:   Procedures:,,,, ultrasound used (permanent image in chart),,,,  Narrative:  Start time: 03/25/2019 7:12 AM End time: 03/25/2019 7:22 AM Injection made incrementally with aspirations every 5 mL.  Performed by: Personally  Anesthesiologist: Freddrick March, MD  Additional Notes: Monitors applied. No increased pain on injection. No increased resistance to injection. Injection made in 5cc increments. Good needle visualization. Patient tolerated procedure well.

## 2019-03-25 NOTE — Transfer of Care (Signed)
Immediate Anesthesia Transfer of Care Note  Patient: Caleb Bentley  Procedure(s) Performed: OPEN REDUCTION INTERNAL FIXATION (ORIF) CALCANEOUS FRACTURE/SUBTALAR FUSION  (Right Leg Lower) Removal Of Cemented Spacer TIBIA (Right Leg Lower) Harvest Femoral Bone Graft (Right Leg Upper)  Patient Location: PACU  Anesthesia Type:General and Regional  Level of Consciousness: awake  Airway & Oxygen Therapy: Patient Spontanous Breathing and Patient connected to face mask oxygen  Post-op Assessment: Report given to RN and Post -op Vital signs reviewed and stable  Post vital signs: Reviewed and stable  Last Vitals:  Vitals Value Taken Time  BP 150/79 03/25/19 1103  Temp    Pulse 103 03/25/19 1107  Resp 19 03/25/19 1107  SpO2 100 % 03/25/19 1107  Vitals shown include unvalidated device data.  Last Pain:  Vitals:   03/25/19 0611  TempSrc:   PainSc: 0-No pain      Patients Stated Pain Goal: 2 (39/76/73 [id number])  Complications: No apparent anesthesia complications

## 2019-03-25 NOTE — Anesthesia Procedure Notes (Signed)
Anesthesia Regional Block: Adductor canal block   Pre-Anesthetic Checklist: ,, timeout performed, Correct Patient, Correct Site, Correct Laterality, Correct Procedure, Correct Position, site marked, Risks and benefits discussed,  Surgical consent,  Pre-op evaluation,  At surgeon's request and post-op pain management  Laterality: Right  Prep: Maximum Sterile Barrier Precautions used, chloraprep       Needles:  Injection technique: Single-shot  Needle Type: Echogenic Stimulator Needle     Needle Length: 9cm  Needle Gauge: 22     Additional Needles:   Procedures:,,,, ultrasound used (permanent image in chart),,,,  Narrative:  Start time: 03/25/2019 7:23 AM End time: 03/25/2019 7:33 AM Injection made incrementally with aspirations every 5 mL.  Performed by: Personally  Anesthesiologist: Freddrick March, MD  Additional Notes: Monitors applied. No increased pain on injection. No increased resistance to injection. Injection made in 5cc increments. Good needle visualization. Patient tolerated procedure well.

## 2019-03-25 NOTE — Anesthesia Procedure Notes (Signed)
Procedure Name: LMA Insertion Date/Time: 03/25/2019 8:16 AM Performed by: Clearnce Sorrel, CRNA Pre-anesthesia Checklist: Patient identified, Emergency Drugs available, Suction available, Patient being monitored and Timeout performed Patient Re-evaluated:Patient Re-evaluated prior to induction Oxygen Delivery Method: Circle system utilized Preoxygenation: Pre-oxygenation with 100% oxygen Induction Type: IV induction LMA Size: 4.0 Number of attempts: 1 Placement Confirmation: positive ETCO2 and breath sounds checked- equal and bilateral Tube secured with: Tape Dental Injury: Teeth and Oropharynx as per pre-operative assessment

## 2019-03-25 NOTE — Anesthesia Postprocedure Evaluation (Signed)
Anesthesia Post Note  Patient: Caleb Bentley  Procedure(s) Performed: OPEN REDUCTION INTERNAL FIXATION (ORIF) CALCANEOUS FRACTURE/SUBTALAR FUSION  (Right Leg Lower) Removal Of Cemented Spacer TIBIA (Right Leg Lower) Harvest Femoral Bone Graft (Right Leg Upper)     Patient location during evaluation: PACU Anesthesia Type: Regional and General Level of consciousness: awake and alert Pain management: pain level controlled Vital Signs Assessment: post-procedure vital signs reviewed and stable Respiratory status: spontaneous breathing, nonlabored ventilation, respiratory function stable and patient connected to nasal cannula oxygen Cardiovascular status: blood pressure returned to baseline and stable Postop Assessment: no apparent nausea or vomiting Anesthetic complications: no    Last Vitals:  Vitals:   03/25/19 1220 03/25/19 1235  BP: 120/76 123/75  Pulse: 77 97  Resp: 16 16  Temp: (!) 36.3 C 36.7 C  SpO2: 99% 100%    Last Pain:  Vitals:   03/25/19 1235  TempSrc: Oral  PainSc:         RLE Motor Response: (P) Other (Comment)(unable to move toes due to the block) (03/25/19 1300) RLE Sensation: (P) Tingling (03/25/19 1300)      [name] [name]

## 2019-03-26 ENCOUNTER — Encounter (HOSPITAL_COMMUNITY): Payer: Self-pay | Admitting: Student

## 2020-07-19 IMAGING — CT CT OF THE RIGHT ANKLE WITHOUT CONTRAST
3 of 4 series · 14 of 35 positions shown, 17 images · non-contrast
Comparison: None.

CLINICAL DATA: Right ankle trauma with severe fracture

EXAM:
CT OF THE RIGHT ANKLE WITHOUT CONTRAST
TECHNIQUE: Multidetector CT imaging of the right ankle was performed according
to the standard protocol. Multiplanar CT image reconstructions were
also generated.

[Series 4: lfov ext 3.0 b40s · axial · 0.41mm/px · z∈[-112,+38]mm · 8 of 60 slices shown, 10 images]
[im 5/60  soft-tissue]
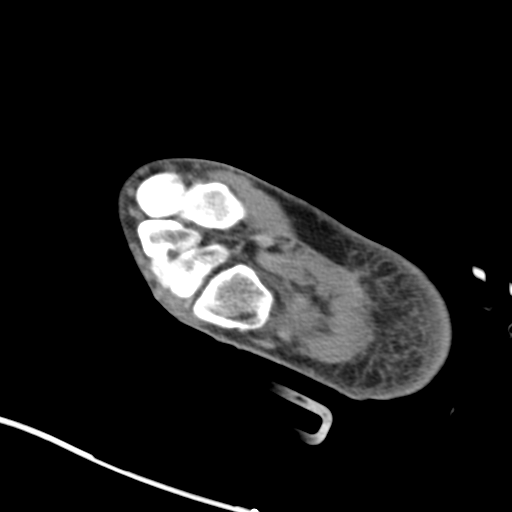
[im 5/60  bone]
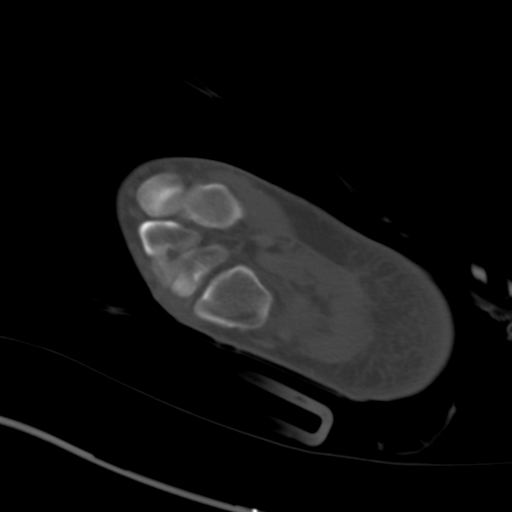
[im 14/60  bone]
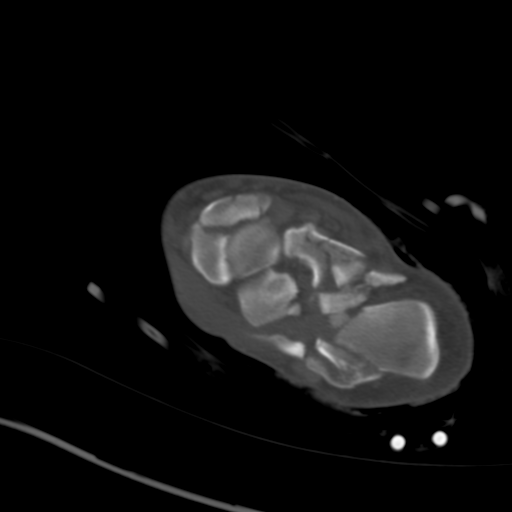
[im 19/60  bone]
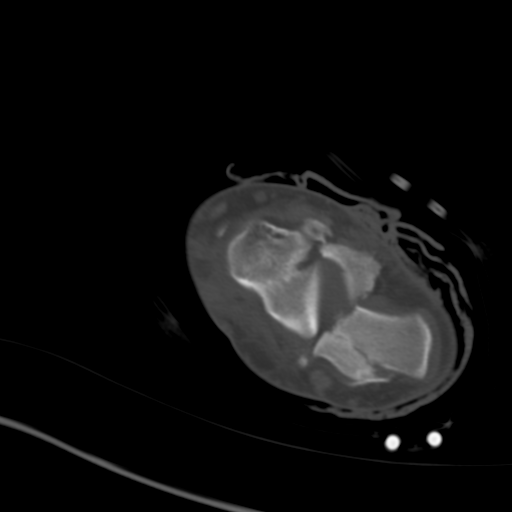
[im 28/60  bone]
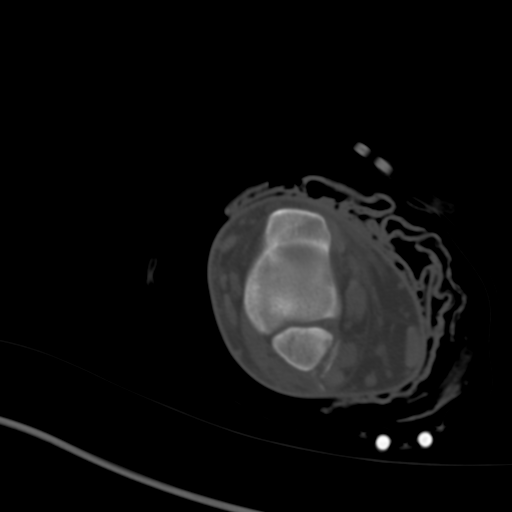
[im 32/60  soft-tissue]
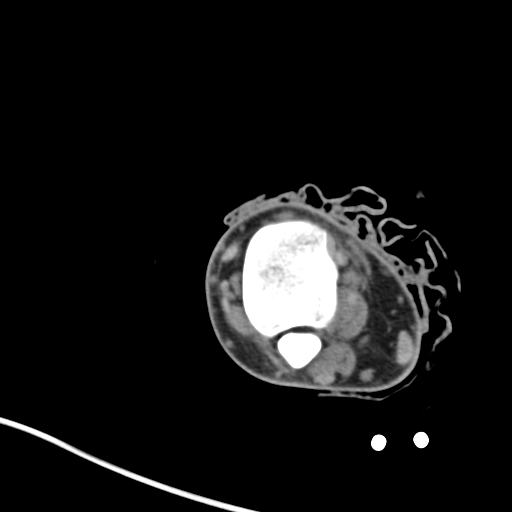
[im 32/60  bone]
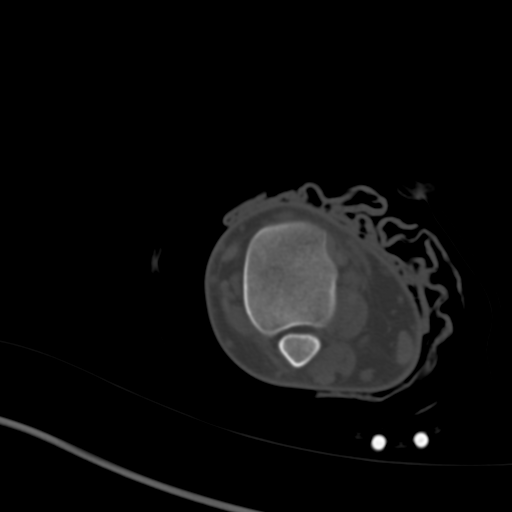
[im 41/60  bone]
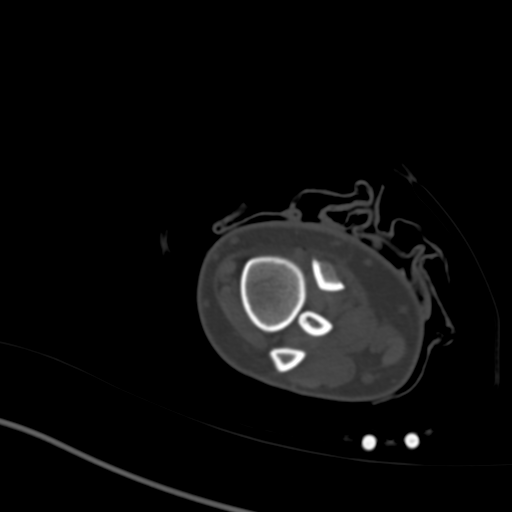
[im 46/60  bone]
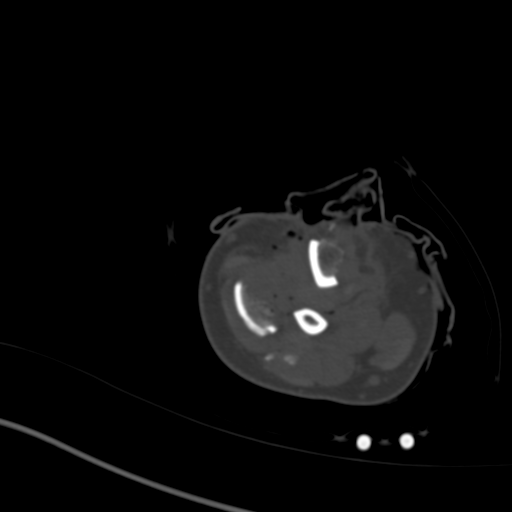
[im 55/60  bone]
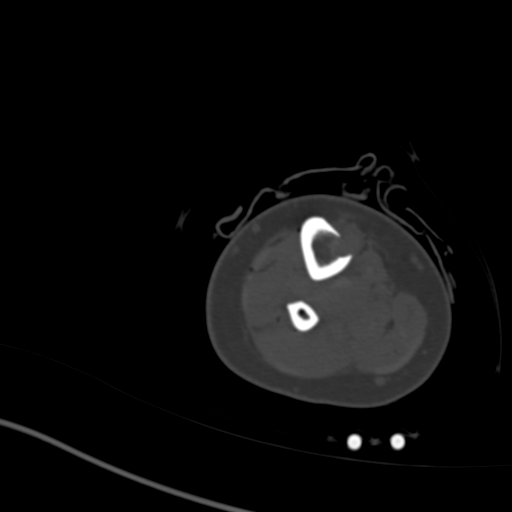

[Series 5: coronal bone · sagittal · 0.40mm/px · 5 of 106 slices shown, 6 images]
[im 36/106  bone]
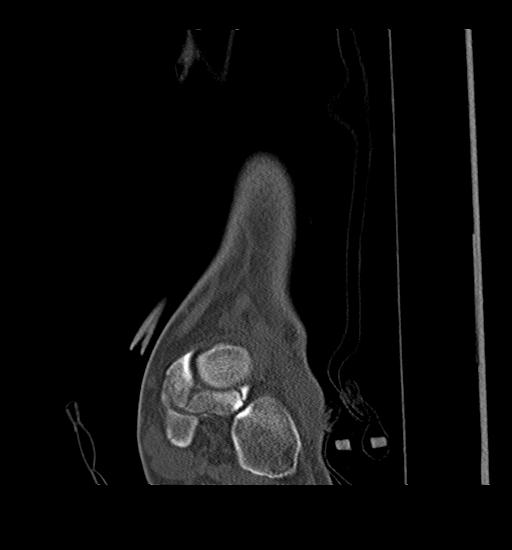
[im 44/106  bone]
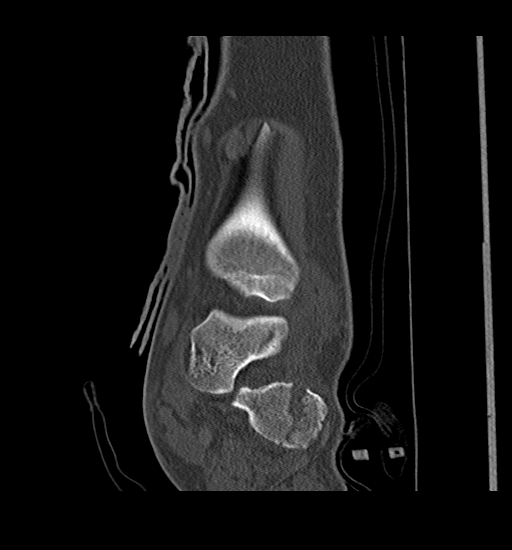
[im 53/106  soft-tissue]
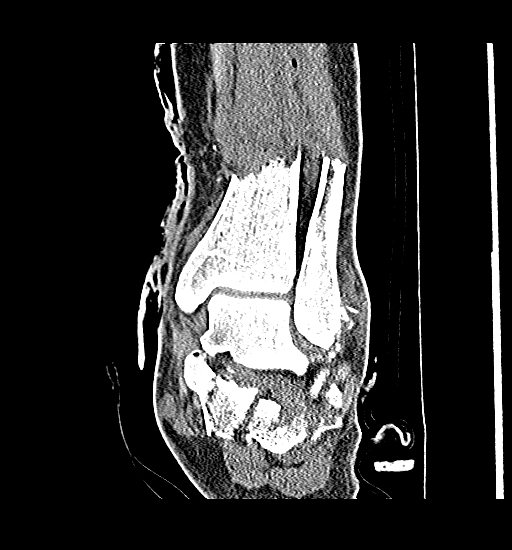
[im 53/106  bone]
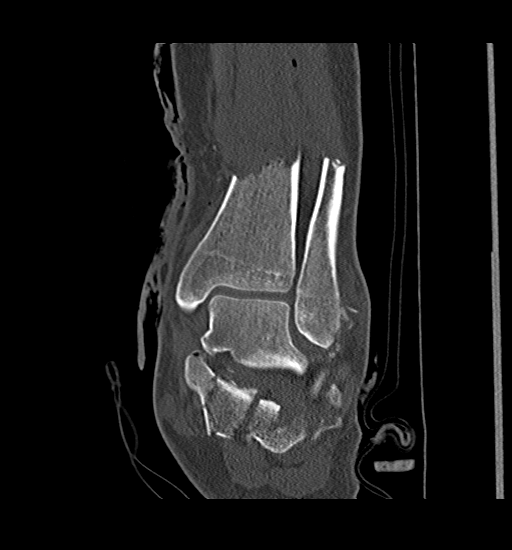
[im 62/106  bone]
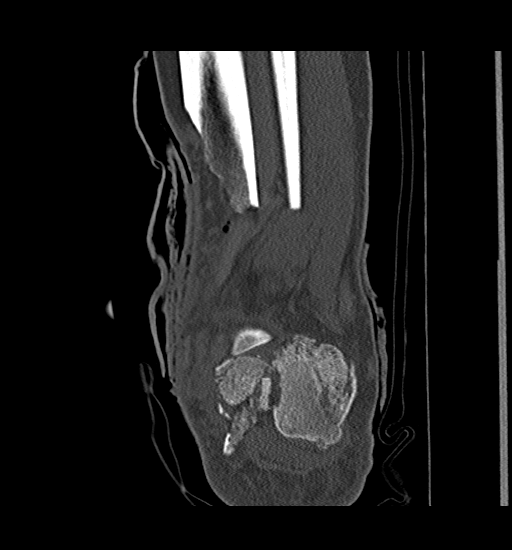
[im 71/106  bone]
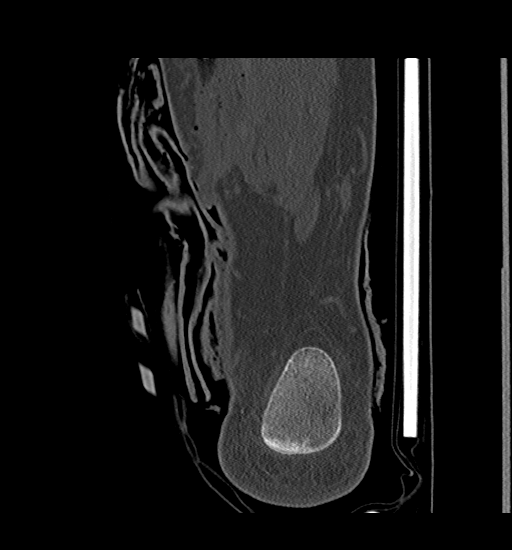

[Series 7: coronalsoft tissue · coronal · 0.27mm/px · 1 of 101 slices shown]
[im 51/101  bone]
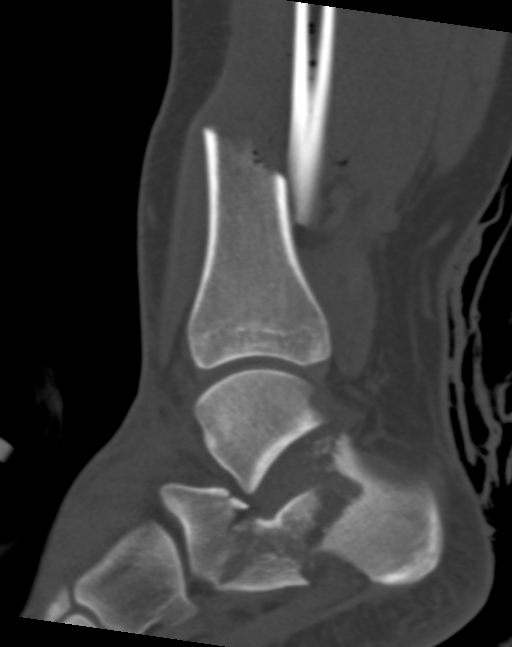

[14 of 35 positions shown; findings below may reference images not displayed]

FINDINGS: Bones/Joint/Cartilage

Comminuted fracture of the distal tibial diametaphysis with 3 cm of
anterior displacement and 13 mm of lateral displacement.

Transverse fracture of the distal fibular diaphysis with 12 mm of
lateral displacement and 16 mm of overriding between the fracture
fragments. Comminuted fracture of the lateral aspect of the lateral
malleolus.

Severely comminuted fracture of the mid and anterior calcaneus with
severe fragmentation of the middle subtalar joint with significant
widening and depression of the middle facet. Comminuted fracture
involving the lateral subtalar joint with depression and lateral
displacement of the lateral facet. Nondisplaced fracture of the
sustentacular talus.

Comminuted fracture of the navicular without significant
displacement.

Ankle mortise is intact.

Ligaments

Suboptimally assessed by CT.

Muscles and Tendons

Muscles are normal. No muscle atrophy. Flexor, extensor, peroneal
and Achilles tendons are intact.

Soft tissues

Mild soft tissue emphysema in the anterior compartment musculature
which may be iatrogenic versus secondary to an open fracture.
IMPRESSION: Comminuted fracture of the distal tibial diametaphysis with 3 cm of
anterior displacement and 13 mm of lateral displacement.

Transverse fracture of the distal fibular diaphysis with 12 mm of
lateral displacement and 16 mm of overriding between the fracture
fragments. Comminuted fracture of the lateral aspect of the lateral
malleolus.

Severely comminuted fracture of the mid and anterior calcaneus with
severe fragmentation of the middle subtalar joint with significant
widening and depression of the middle facet. Comminuted fracture
involving the lateral subtalar joint with depression and lateral
displacement of the lateral facet. Nondisplaced fracture of the
sustentacular talus.
